# Patient Record
Sex: Male | Born: 1955 | Race: Black or African American | Hispanic: No | Marital: Married | Smoking: Current every day smoker
Health system: Southern US, Community
[De-identification: ages and names within clinical notes are randomized; demographics above are authoritative.]

## PROBLEM LIST (undated history)

## (undated) DIAGNOSIS — C801 Malignant (primary) neoplasm, unspecified: Secondary | ICD-10-CM

## (undated) DIAGNOSIS — F319 Bipolar disorder, unspecified: Secondary | ICD-10-CM

## (undated) DIAGNOSIS — J449 Chronic obstructive pulmonary disease, unspecified: Secondary | ICD-10-CM

---

## 2000-06-21 ENCOUNTER — Encounter: Payer: Self-pay | Admitting: Emergency Medicine

## 2000-06-21 ENCOUNTER — Emergency Department (HOSPITAL_COMMUNITY): Admission: EM | Admit: 2000-06-21 | Discharge: 2000-06-21 | Payer: Self-pay | Admitting: Emergency Medicine

## 2016-02-07 ENCOUNTER — Emergency Department (HOSPITAL_COMMUNITY): Payer: Medicare Other | Admitting: Certified Registered"

## 2016-02-07 ENCOUNTER — Inpatient Hospital Stay (HOSPITAL_COMMUNITY)
Admission: EM | Admit: 2016-02-07 | Discharge: 2016-02-15 | DRG: 459 | Disposition: A | Discharge status: Skilled Nursing Facility | Payer: Medicare Other | Attending: Neurological Surgery | Admitting: Neurological Surgery

## 2016-02-07 ENCOUNTER — Emergency Department (HOSPITAL_COMMUNITY): Payer: Medicare Other

## 2016-02-07 ENCOUNTER — Encounter (HOSPITAL_COMMUNITY)
Admission: EM | Disposition: A | Discharge status: Skilled Nursing Facility | Payer: Self-pay | Source: Home / Self Care | Attending: Neurological Surgery

## 2016-02-07 ENCOUNTER — Encounter (HOSPITAL_COMMUNITY): Payer: Self-pay | Admitting: Emergency Medicine

## 2016-02-07 DIAGNOSIS — S50311A Abrasion of right elbow, initial encounter: Secondary | ICD-10-CM | POA: Diagnosis present

## 2016-02-07 DIAGNOSIS — S32011A Stable burst fracture of first lumbar vertebra, initial encounter for closed fracture: Secondary | ICD-10-CM | POA: Diagnosis present

## 2016-02-07 DIAGNOSIS — S32000A Wedge compression fracture of unspecified lumbar vertebra, initial encounter for closed fracture: Secondary | ICD-10-CM

## 2016-02-07 DIAGNOSIS — Z452 Encounter for adjustment and management of vascular access device: Secondary | ICD-10-CM

## 2016-02-07 DIAGNOSIS — M532X6 Spinal instabilities, lumbar region: Secondary | ICD-10-CM | POA: Diagnosis present

## 2016-02-07 DIAGNOSIS — S32001A Stable burst fracture of unspecified lumbar vertebra, initial encounter for closed fracture: Secondary | ICD-10-CM

## 2016-02-07 DIAGNOSIS — J449 Chronic obstructive pulmonary disease, unspecified: Secondary | ICD-10-CM | POA: Diagnosis present

## 2016-02-07 DIAGNOSIS — N320 Bladder-neck obstruction: Secondary | ICD-10-CM | POA: Diagnosis not present

## 2016-02-07 DIAGNOSIS — S34139A Unspecified injury to sacral spinal cord, initial encounter: Secondary | ICD-10-CM | POA: Diagnosis present

## 2016-02-07 DIAGNOSIS — M549 Dorsalgia, unspecified: Secondary | ICD-10-CM | POA: Diagnosis present

## 2016-02-07 DIAGNOSIS — Z886 Allergy status to analgesic agent status: Secondary | ICD-10-CM

## 2016-02-07 DIAGNOSIS — F17210 Nicotine dependence, cigarettes, uncomplicated: Secondary | ICD-10-CM | POA: Diagnosis present

## 2016-02-07 DIAGNOSIS — G839 Paralytic syndrome, unspecified: Secondary | ICD-10-CM | POA: Diagnosis not present

## 2016-02-07 DIAGNOSIS — R339 Retention of urine, unspecified: Secondary | ICD-10-CM | POA: Diagnosis not present

## 2016-02-07 DIAGNOSIS — Z419 Encounter for procedure for purposes other than remedying health state, unspecified: Secondary | ICD-10-CM

## 2016-02-07 HISTORY — DX: Chronic obstructive pulmonary disease, unspecified: J44.9

## 2016-02-07 HISTORY — PX: POSTERIOR LUMBAR FUSION 4 LEVEL: SHX6037

## 2016-02-07 HISTORY — DX: Bipolar disorder, unspecified: F31.9

## 2016-02-07 HISTORY — DX: Malignant (primary) neoplasm, unspecified: C80.1

## 2016-02-07 SURGERY — POSTERIOR LUMBAR FUSION 4 LEVEL
Anesthesia: General

## 2016-02-07 MED ORDER — METHOCARBAMOL 1000 MG/10ML IJ SOLN
1000.0000 mg | Freq: Once | INTRAMUSCULAR | Status: AC
  Administered 2016-02-07: 1 g via INTRAVENOUS
  Filled 2016-02-07: qty 10

## 2016-02-07 MED ORDER — HEPARIN SODIUM (PORCINE) 1000 UNIT/ML IJ SOLN
INTRAMUSCULAR | Status: AC
  Filled 2016-02-07: qty 1

## 2016-02-07 MED ORDER — LACTATED RINGERS IV SOLN
INTRAVENOUS | Status: DC | PRN
  Administered 2016-02-07 (×3): via INTRAVENOUS

## 2016-02-07 MED ORDER — THROMBIN 20000 UNITS EX SOLR
CUTANEOUS | Status: DC | PRN
  Administered 2016-02-07: 23:00:00 via TOPICAL

## 2016-02-07 MED ORDER — HYDROMORPHONE HCL 1 MG/ML IJ SOLN
1.0000 mg | INTRAMUSCULAR | Status: DC | PRN
  Administered 2016-02-07: 1 mg via INTRAVENOUS
  Filled 2016-02-07: qty 1

## 2016-02-07 MED ORDER — SUCCINYLCHOLINE 20MG/ML (10ML) SYRINGE FOR MEDFUSION PUMP - OPTIME
INTRAMUSCULAR | Status: DC | PRN
  Administered 2016-02-07: 120 mg via INTRAVENOUS

## 2016-02-07 MED ORDER — 0.9 % SODIUM CHLORIDE (POUR BTL) OPTIME
TOPICAL | Status: DC | PRN
  Administered 2016-02-07: 1000 mL

## 2016-02-07 MED ORDER — FENTANYL CITRATE (PF) 100 MCG/2ML IJ SOLN
INTRAMUSCULAR | Status: DC | PRN
  Administered 2016-02-07: 150 ug via INTRAVENOUS
  Administered 2016-02-07: 100 ug via INTRAVENOUS
  Administered 2016-02-08 (×2): 50 ug via INTRAVENOUS

## 2016-02-07 MED ORDER — BUPIVACAINE LIPOSOME 1.3 % IJ SUSP
20.0000 mL | Freq: Once | INTRAMUSCULAR | Status: DC
  Filled 2016-02-07: qty 20

## 2016-02-07 MED ORDER — MIDAZOLAM HCL 5 MG/5ML IJ SOLN
INTRAMUSCULAR | Status: DC | PRN
  Administered 2016-02-07: 2 mg via INTRAVENOUS

## 2016-02-07 MED ORDER — SODIUM CHLORIDE 0.9 % IR SOLN
Status: DC | PRN
  Administered 2016-02-07 – 2016-02-08 (×2)

## 2016-02-07 MED ORDER — DIAZEPAM 5 MG/ML IJ SOLN
2.5000 mg | Freq: Once | INTRAMUSCULAR | Status: AC
  Administered 2016-02-07: 2.5 mg via INTRAVENOUS
  Filled 2016-02-07: qty 2

## 2016-02-07 MED ORDER — ROCURONIUM BROMIDE 100 MG/10ML IV SOLN
INTRAVENOUS | Status: DC | PRN
  Administered 2016-02-07: 10 mg via INTRAVENOUS
  Administered 2016-02-07: 20 mg via INTRAVENOUS
  Administered 2016-02-07: 50 mg via INTRAVENOUS
  Administered 2016-02-08: 10 mg via INTRAVENOUS

## 2016-02-07 MED ORDER — LIDOCAINE HCL (CARDIAC) 20 MG/ML IV SOLN
INTRAVENOUS | Status: DC | PRN
  Administered 2016-02-07: 60 mg via INTRAVENOUS

## 2016-02-07 MED ORDER — VANCOMYCIN HCL 1000 MG IV SOLR
INTRAVENOUS | Status: DC | PRN
  Administered 2016-02-08 (×2): 1000 mg via TOPICAL

## 2016-02-07 MED ORDER — PROPOFOL 10 MG/ML IV BOLUS
INTRAVENOUS | Status: DC | PRN
  Administered 2016-02-07: 140 mg via INTRAVENOUS

## 2016-02-07 MED ORDER — BUPIVACAINE LIPOSOME 1.3 % IJ SUSP
INTRAMUSCULAR | Status: DC | PRN
  Administered 2016-02-07: 20 mL

## 2016-02-07 MED ORDER — HYDROMORPHONE HCL 1 MG/ML IJ SOLN
1.0000 mg | Freq: Once | INTRAMUSCULAR | Status: AC
  Administered 2016-02-07: 1 mg via INTRAMUSCULAR
  Filled 2016-02-07: qty 1

## 2016-02-07 MED ORDER — PHENYLEPHRINE HCL 10 MG/ML IJ SOLN
10.0000 mg | INTRAVENOUS | Status: DC | PRN
  Administered 2016-02-07: 30 ug/min via INTRAVENOUS

## 2016-02-07 MED ORDER — OXYCODONE-ACETAMINOPHEN 5-325 MG PO TABS
2.0000 | ORAL_TABLET | Freq: Once | ORAL | Status: AC
  Administered 2016-02-07: 2 via ORAL
  Filled 2016-02-07: qty 2

## 2016-02-07 MED ORDER — OXYCODONE-ACETAMINOPHEN 5-325 MG PO TABS: 1.0000 | ORAL_TABLET | ORAL | Status: AC | PRN

## 2016-02-07 MED ORDER — KETAMINE HCL-SODIUM CHLORIDE 100-0.9 MG/10ML-% IV SOSY
0.3000 mg/kg | PREFILLED_SYRINGE | Freq: Once | INTRAVENOUS | Status: AC
  Administered 2016-02-07: 20 mg via INTRAVENOUS
  Filled 2016-02-07: qty 10

## 2016-02-07 MED ORDER — CEFAZOLIN SODIUM 1 G IJ SOLR
INTRAMUSCULAR | Status: DC | PRN
  Administered 2016-02-07: 2 g via INTRAMUSCULAR

## 2016-02-07 MED ORDER — PHENYLEPHRINE HCL 10 MG/ML IJ SOLN
INTRAMUSCULAR | Status: DC | PRN
  Administered 2016-02-07: 120 ug via INTRAVENOUS
  Administered 2016-02-07: 240 ug via INTRAVENOUS
  Administered 2016-02-07 (×2): 120 ug via INTRAVENOUS

## 2016-02-07 MED ORDER — ACETAMINOPHEN 10 MG/ML IV SOLN
INTRAVENOUS | Status: DC | PRN
  Administered 2016-02-07: 1000 mg via INTRAVENOUS

## 2016-02-07 MED ORDER — FENTANYL CITRATE (PF) 100 MCG/2ML IJ SOLN
50.0000 ug | Freq: Once | INTRAMUSCULAR | Status: AC
  Administered 2016-02-07: 50 ug via INTRAVENOUS
  Filled 2016-02-07: qty 2

## 2016-02-07 MED ORDER — KETAMINE HCL 100 MG/ML IJ SOLN
INTRAMUSCULAR | Status: AC
  Filled 2016-02-07: qty 1

## 2016-02-07 MED ORDER — ALBUMIN HUMAN 5 % IV SOLN
INTRAVENOUS | Status: DC | PRN
  Administered 2016-02-07: via INTRAVENOUS

## 2016-02-07 MED ORDER — LIDOCAINE-EPINEPHRINE 2 %-1:100000 IJ SOLN
INTRAMUSCULAR | Status: DC | PRN
  Administered 2016-02-07: 20 mL via INTRADERMAL

## 2016-02-07 MED ORDER — THROMBIN 5000 UNITS EX SOLR
OROMUCOSAL | Status: DC | PRN
  Administered 2016-02-07: 23:00:00 via TOPICAL

## 2016-02-07 SURGICAL SUPPLY — 90 items
ADH SKN CLS APL DERMABOND .7 (GAUZE/BANDAGES/DRESSINGS) ×2
APL SKNCLS STERI-STRIP NONHPOA (GAUZE/BANDAGES/DRESSINGS)
BAG DECANTER FOR FLEXI CONT (MISCELLANEOUS) ×3 IMPLANT
BENZOIN TINCTURE PRP APPL 2/3 (GAUZE/BANDAGES/DRESSINGS) ×1 IMPLANT
BLADE CLIPPER SURG (BLADE) ×2 IMPLANT
BLADE SURG 11 STRL SS (BLADE) ×1 IMPLANT
BONE ALLOSTEM MORSELIZED 5CC (Bone Implant) ×2 IMPLANT
BONE MATRIX OSTEOCEL PRO MED (Bone Implant) ×4 IMPLANT
BONE MATRIX OSTEOCEL PRO SM (Bone Implant) ×6 IMPLANT
BUR MATCHSTICK NEURO 3.0 LAGG (BURR) ×3 IMPLANT
BUR ROUND FLUTED 5 RND (BURR) ×2 IMPLANT
BUR ROUND FLUTED 5MM RND (BURR) ×1
CANISTER SUCT 3000ML PPV (MISCELLANEOUS) ×2 IMPLANT
CATH COUDE FOLEY 2W 5CC 16FR (CATHETERS) ×4 IMPLANT
CHLORAPREP W/TINT 26ML (MISCELLANEOUS) ×3 IMPLANT
CONNECTOR RELINE 40-50MM 5.5 (Connector) ×2 IMPLANT
DECANTER SPIKE VIAL GLASS SM (MISCELLANEOUS) ×3 IMPLANT
DERMABOND ADVANCED (GAUZE/BANDAGES/DRESSINGS) ×4
DERMABOND ADVANCED .7 DNX12 (GAUZE/BANDAGES/DRESSINGS) ×1 IMPLANT
DRAPE C-ARM 42X72 X-RAY (DRAPES) ×6 IMPLANT
DRAPE C-ARMOR (DRAPES) ×2 IMPLANT
DRAPE MICROSCOPE LEICA (MISCELLANEOUS) IMPLANT
DRAPE POUCH INSTRU U-SHP 10X18 (DRAPES) ×3 IMPLANT
DRAPE SURG 17X23 STRL (DRAPES) ×3 IMPLANT
DRSG OPSITE POSTOP 4X10 (GAUZE/BANDAGES/DRESSINGS) ×2 IMPLANT
ELECT REM PT RETURN 9FT ADLT (ELECTROSURGICAL) ×6
ELECTRODE REM PT RTRN 9FT ADLT (ELECTROSURGICAL) ×1 IMPLANT
EVACUATOR 1/8 PVC DRAIN (DRAIN) ×2 IMPLANT
GAUZE SPONGE 4X4 12PLY STRL (GAUZE/BANDAGES/DRESSINGS) IMPLANT
GAUZE SPONGE 4X4 16PLY XRAY LF (GAUZE/BANDAGES/DRESSINGS) IMPLANT
GLOVE BIOGEL PI IND STRL 7.5 (GLOVE) ×2 IMPLANT
GLOVE BIOGEL PI IND STRL 8.5 (GLOVE) IMPLANT
GLOVE BIOGEL PI INDICATOR 7.5 (GLOVE) ×4
GLOVE BIOGEL PI INDICATOR 8.5 (GLOVE) ×2
GLOVE ECLIPSE 7.5 STRL STRAW (GLOVE) ×4 IMPLANT
GLOVE ECLIPSE 8.5 STRL (GLOVE) ×2 IMPLANT
GLOVE SS BIOGEL STRL SZ 7 (GLOVE) ×3 IMPLANT
GLOVE SUPERSENSE BIOGEL SZ 7 (GLOVE) ×6
GOWN STRL REUS W/ TWL LRG LVL3 (GOWN DISPOSABLE) ×2 IMPLANT
GOWN STRL REUS W/ TWL XL LVL3 (GOWN DISPOSABLE) IMPLANT
GOWN STRL REUS W/TWL 2XL LVL3 (GOWN DISPOSABLE) ×2 IMPLANT
GOWN STRL REUS W/TWL LRG LVL3 (GOWN DISPOSABLE) ×6
GOWN STRL REUS W/TWL XL LVL3 (GOWN DISPOSABLE)
HEMOSTAT POWDER KIT SURGIFOAM (HEMOSTASIS) ×1 IMPLANT
KIT BASIN OR (CUSTOM PROCEDURE TRAY) ×3 IMPLANT
KIT ROOM TURNOVER OR (KITS) ×3 IMPLANT
NDL HYPO 21X1.5 SAFETY (NEEDLE) ×2 IMPLANT
NDL HYPO 25X1 1.5 SAFETY (NEEDLE) ×1 IMPLANT
NEEDLE HYPO 21X1.5 SAFETY (NEEDLE) ×6 IMPLANT
NEEDLE HYPO 25X1 1.5 SAFETY (NEEDLE) ×3 IMPLANT
NS IRRIG 1000ML POUR BTL (IV SOLUTION) ×3 IMPLANT
PACK LAMINECTOMY NEURO (CUSTOM PROCEDURE TRAY) ×3 IMPLANT
PACK UNIVERSAL I (CUSTOM PROCEDURE TRAY) ×3 IMPLANT
PAD ARMBOARD 7.5X6 YLW CONV (MISCELLANEOUS) ×9 IMPLANT
PATTIES SURGICAL .5X1.5 (GAUZE/BANDAGES/DRESSINGS) ×3 IMPLANT
PEDIGUARD CURV (INSTRUMENTS) ×2 IMPLANT
PUTTY BONE ATTRAX 10CC STRIP (Putty) ×4 IMPLANT
ROD RELINE-O 5.5X300 STRT NS (Rod) IMPLANT
ROD RELINE-O 5.5X300MM STRT (Rod) ×6 IMPLANT
RUBBERBAND STERILE (MISCELLANEOUS) IMPLANT
SCREW LOCK RELINE 5.5 TULIP (Screw) ×20 IMPLANT
SCREW SHANK RELINE 6.5X50MM 2S (Screw) ×4 IMPLANT
SCREW SHANK RELINE 6.5X55MM 2S (Screw) ×8 IMPLANT
SCREW SHANK RELINE-O 6.5X45MM (Screw) ×8 IMPLANT
SEALER BIPOLAR AQUA 2.3 (INSTRUMENTS) ×2 IMPLANT
SPINE TULIP RELINE MOD (Neuro Prosthesis/Implant) ×30 IMPLANT
SPONGE NEURO XRAY DETECT 1X3 (DISPOSABLE) ×1 IMPLANT
SPONGE SURGIFOAM ABS GEL 100 (HEMOSTASIS) ×3 IMPLANT
STRIP SURGICAL 1 X 6 IN (GAUZE/BANDAGES/DRESSINGS) IMPLANT
STRIP SURGICAL 1/2 X 6 IN (GAUZE/BANDAGES/DRESSINGS) IMPLANT
STRIP SURGICAL 1/4 X 6 IN (GAUZE/BANDAGES/DRESSINGS) IMPLANT
STRIP SURGICAL 3/4 X 6 IN (GAUZE/BANDAGES/DRESSINGS) IMPLANT
SUT STRATAFIX 1PDS 45CM VIOLET (SUTURE) ×2 IMPLANT
SUT STRATAFIX MNCRL+ 3-0 PS-2 (SUTURE) ×1
SUT STRATAFIX MONOCRYL 3-0 (SUTURE) ×2
SUT STRATAFIX SPIRAL + 2-0 (SUTURE) ×2 IMPLANT
SUT VIC AB 0 CT1 18XCR BRD8 (SUTURE) ×2 IMPLANT
SUT VIC AB 0 CT1 8-18 (SUTURE) ×3
SUT VIC AB 2-0 CT1 18 (SUTURE) ×2 IMPLANT
SUT VIC AB 3-0 SH 8-18 (SUTURE) ×2 IMPLANT
SUT VIC AB 4-0 PS2 27 (SUTURE) ×1 IMPLANT
SUTURE STRATFX MNCRL+ 3-0 PS-2 (SUTURE) IMPLANT
SYR 20CC LL (SYRINGE) ×3 IMPLANT
SYR 30ML LL (SYRINGE) ×5 IMPLANT
TOWEL OR 17X24 6PK STRL BLUE (TOWEL DISPOSABLE) ×3 IMPLANT
TOWEL OR 17X26 10 PK STRL BLUE (TOWEL DISPOSABLE) ×3 IMPLANT
TUBE CONNECTING 12'X1/4 (SUCTIONS)
TUBE CONNECTING 12X1/4 (SUCTIONS) ×1 IMPLANT
TULIP SPINE RELINE MOD (Neuro Prosthesis/Implant) IMPLANT
WATER STERILE IRR 1000ML POUR (IV SOLUTION) ×3 IMPLANT
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
## 2016-02-07 NOTE — ED Notes (Signed)
Charge RN at [facility] ED called, aware pt coming.
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
## 2016-02-07 NOTE — Progress Notes (Signed)
I assumed care of this patient at shift change. The plan is to follow up on the CT scan and if there is no retropulsion then we will have the pt follow up with neurosurgery.   1700: Informed the patient that his L1 compression fracture has retropulsion and we will consult neurosurgery. Patient states his pain was not well controlled so I ordered Dilaudid 1 mg q2 hours PRN. I discussed the case with Dr. Maryan Rued and she will place the order for the consult and speak with neurosurgery regarding this patient's care. Patient will be transferred to [facility] ER.  1930: Transported here for patient, patient in severe pain even after Ketamine, will give 50 g fentanyl before he leaves.
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
## 2016-02-07 NOTE — Discharge Instructions (Signed)
Take the prescribed medication as directed.  Use caution when taking this, it can make you sleepy/drowsy. Follow-up with [place] neurosurgery-- call to make appt. Return to the ED for new or worsening symptoms.

## 2016-02-07 NOTE — Anesthesia Procedure Notes (Signed)
Procedure Name: Intubation Date/Time: 02/07/2016 9:51 PM Performed by: Lance Coon Pre-anesthesia Checklist: Patient identified, Emergency Drugs available, Suction available, Patient being monitored and Timeout performed Patient Re-evaluated:Patient Re-evaluated prior to inductionOxygen Delivery Method: Circle system utilized Preoxygenation: Pre-oxygenation with 100% oxygen Intubation Type: IV induction, Cricoid Pressure applied and Rapid sequence Laryngoscope Size: Miller and 3 Grade View: Grade I Tube type: Oral Number of attempts: 1 Airway Equipment and Method: Stylet Placement Confirmation: ETT inserted through vocal cords under direct vision,  positive ETCO2 and breath sounds checked- equal and bilateral Secured at: 22 cm Tube secured with: Tape Dental Injury: Teeth and Oropharynx as per pre-operative assessment

## 2016-02-07 NOTE — ED Notes (Signed)
Off floor for testing 

## 2016-02-07 NOTE — ED Notes (Signed)
Patient transported to CT 
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
## 2016-02-07 NOTE — ED Provider Notes (Signed)
Patient with burst fracture retropulsion. Spoke with Dr. daily who wants patient transferred to the [facility] emergency room. Patient is to remain flat. Due to ongoing pain will try analgesic ketamine.  Blanchie Dessert, MD 02/07/16 [phone]

## 2016-02-07 NOTE — ED Notes (Signed)
Pt desated to 80%RA after giving Dilaudid and Valium. Started O2 at 3lpm via [place].
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
## 2016-02-08 ENCOUNTER — Inpatient Hospital Stay (HOSPITAL_COMMUNITY): Payer: Medicare Other

## 2016-02-08 LAB — COMPREHENSIVE METABOLIC PANEL
ALBUMIN: 3.1 g/dL — AB (ref 3.5–5.0)
ALK PHOS: 45 U/L (ref 38–126)
ALK PHOS: 50 U/L (ref 38–126)
ALT: 11 U/L — ABNORMAL LOW (ref 17–63)
ALT: 14 U/L — ABNORMAL LOW (ref 17–63)
ANION GAP: 12 (ref 5–15)
AST: 18 U/L (ref 15–41)
AST: 27 U/L (ref 15–41)
Albumin: 3.2 g/dL — ABNORMAL LOW (ref 3.5–5.0)
Anion gap: 11 (ref 5–15)
BILIRUBIN TOTAL: 0.4 mg/dL (ref 0.3–1.2)
BUN: 9 mg/dL (ref 6–20)
BUN: 7 mg/dL (ref 6–20)
CALCIUM: 9 mg/dL (ref 8.9–10.3)
CO2: 21 mmol/L — ABNORMAL LOW (ref 22–32)
CO2: 20 mmol/L — AB (ref 22–32)
Calcium: 8.8 mg/dL — ABNORMAL LOW (ref 8.9–10.3)
Chloride: 107 mmol/L (ref 101–111)
Chloride: 107 mmol/L (ref 101–111)
Creatinine, Ser: 1.02 mg/dL (ref 0.61–1.24)
Creatinine, Ser: 1 mg/dL (ref 0.61–1.24)
GFR calc Af Amer: >60 mL/min (ref >60)
GFR calc non Af Amer: >60 mL/min (ref >60)
GLUCOSE: 144 mg/dL — AB (ref 65–99)
Glucose, Bld: 132 mg/dL — ABNORMAL HIGH (ref 65–99)
POTASSIUM: 3.6 mmol/L (ref 3.5–5.1)
Potassium: 3 mmol/L — ABNORMAL LOW (ref 3.5–5.1)
SODIUM: 139 mmol/L (ref 135–145)
Sodium: 139 mmol/L (ref 135–145)
TOTAL PROTEIN: 5.7 g/dL — AB (ref 6.5–8.1)
Total Bilirubin: 0.6 mg/dL (ref 0.3–1.2)
Total Protein: 5.4 g/dL — ABNORMAL LOW (ref 6.5–8.1)

## 2016-02-08 LAB — TYPE AND SCREEN
ABO/RH(D): O POS
ANTIBODY SCREEN: NEGATIVE

## 2016-02-08 LAB — CBC WITH DIFFERENTIAL/PLATELET
BASOS ABS: 0 10*3/uL (ref 0.0–0.1)
BASOS PCT: 0 %
EOS ABS: 0 10*3/uL (ref 0.0–0.7)
Eosinophils Relative: 0 %
HCT: 31.1 % — ABNORMAL LOW (ref 39.0–52.0)
HEMOGLOBIN: 10.2 g/dL — AB (ref 13.0–17.0)
Lymphocytes Relative: 3 %
Lymphs Abs: 0.3 10*3/uL — ABNORMAL LOW (ref 0.7–4.0)
MCH: 31.2 pg (ref 26.0–34.0)
MCHC: 32.8 g/dL (ref 30.0–36.0)
MCV: 95.1 fL (ref 78.0–100.0)
MONOS PCT: 3 %
Monocytes Absolute: 0.3 10*3/uL (ref 0.1–1.0)
NEUTROS PCT: 95 %
Neutro Abs: 11.8 10*3/uL — ABNORMAL HIGH (ref 1.7–7.7)
Platelets: 104 10*3/uL — ABNORMAL LOW (ref 150–400)
RBC: 3.27 MIL/uL — ABNORMAL LOW (ref 4.22–5.81)
RDW: 13.2 % (ref 11.5–15.5)
WBC: 12.5 10*3/uL — ABNORMAL HIGH (ref 4.0–10.5)

## 2016-02-08 LAB — CBC
HCT: 29.3 % — ABNORMAL LOW (ref 39.0–52.0)
Hemoglobin: 9.6 g/dL — ABNORMAL LOW (ref 13.0–17.0)
MCH: 30.7 pg (ref 26.0–34.0)
MCHC: 32.8 g/dL (ref 30.0–36.0)
MCV: 93.6 fL (ref 78.0–100.0)
PLATELETS: 114 10*3/uL — AB (ref 150–400)
RBC: 3.13 MIL/uL — ABNORMAL LOW (ref 4.22–5.81)
RDW: 13 % (ref 11.5–15.5)
WBC: 7 10*3/uL (ref 4.0–10.5)

## 2016-02-08 LAB — APTT
APTT: 36 s (ref 24–37)
aPTT: 30 seconds (ref 24–37)

## 2016-02-08 LAB — POCT I-STAT 7, (LYTES, BLD GAS, ICA,H+H)
ACID-BASE DEFICIT: 2 mmol/L (ref 0.0–2.0)
Bicarbonate: 25.1 mEq/L — ABNORMAL HIGH (ref 20.0–24.0)
Calcium, Ion: 1.3 mmol/L (ref 1.13–1.30)
HEMATOCRIT: 38 % — AB (ref 39.0–52.0)
HEMOGLOBIN: 12.9 g/dL — AB (ref 13.0–17.0)
O2 SAT: 100 %
PH ART: 7.306 — AB (ref 7.350–7.450)
POTASSIUM: 3 mmol/L — AB (ref 3.5–5.1)
Sodium: 139 mmol/L (ref 135–145)
TCO2: 27 mmol/L (ref 0–100)
pCO2 arterial: 50.4 mmHg — ABNORMAL HIGH (ref 35.0–45.0)
pO2, Arterial: 413 mmHg — ABNORMAL HIGH (ref 80.0–100.0)

## 2016-02-08 LAB — PROTIME-INR
INR: 1.27 (ref 0.00–1.49)
INR: 1.22 (ref 0.00–1.49)
Prothrombin Time: 16 seconds — ABNORMAL HIGH (ref 11.6–15.2)
Prothrombin Time: 15.6 seconds — ABNORMAL HIGH (ref 11.6–15.2)

## 2016-02-08 LAB — ABO/RH: ABO/RH(D): O POS

## 2016-02-08 MED ORDER — ZOLPIDEM TARTRATE 5 MG PO TABS
5.0000 mg | ORAL_TABLET | Freq: Every evening | ORAL | Status: DC | PRN
  Administered 2016-02-13: 5 mg via ORAL
  Filled 2016-02-08: qty 1

## 2016-02-08 MED ORDER — ONDANSETRON HCL 4 MG/2ML IJ SOLN: 4.0000 mg | Freq: Once | INTRAMUSCULAR | Status: DC | PRN

## 2016-02-08 MED ORDER — DEXAMETHASONE SODIUM PHOSPHATE 10 MG/ML IJ SOLN
INTRAMUSCULAR | Status: DC | PRN
  Administered 2016-02-07: 5 mg via INTRAVENOUS

## 2016-02-08 MED ORDER — PHENOL 1.4 % MT LIQD: 1.0000 | OROMUCOSAL | Status: DC | PRN

## 2016-02-08 MED ORDER — CEFAZOLIN SODIUM-DEXTROSE 2-4 GM/100ML-% IV SOLN
2.0000 g | INTRAVENOUS | Status: DC
Start: 2016-02-08 — End: 2016-02-08

## 2016-02-08 MED ORDER — PANTOPRAZOLE SODIUM 40 MG PO TBEC
40.0000 mg | DELAYED_RELEASE_TABLET | Freq: Every day | ORAL | Status: DC
  Administered 2016-02-08 – 2016-02-14 (×7): 40 mg via ORAL
  Filled 2016-02-08 (×7): qty 1

## 2016-02-08 MED ORDER — PANTOPRAZOLE SODIUM 40 MG IV SOLR: 40.0000 mg | Freq: Every day | INTRAVENOUS | Status: DC

## 2016-02-08 MED ORDER — GABAPENTIN 100 MG PO CAPS
200.0000 mg | ORAL_CAPSULE | Freq: Once | ORAL | Status: AC
  Administered 2016-02-08: 200 mg via ORAL
  Filled 2016-02-08: qty 2

## 2016-02-08 MED ORDER — MENTHOL 3 MG MT LOZG: 1.0000 | LOZENGE | OROMUCOSAL | Status: DC | PRN

## 2016-02-08 MED ORDER — SODIUM CHLORIDE 0.9% FLUSH
3.0000 mL | Freq: Two times a day (BID) | INTRAVENOUS | Status: DC
  Administered 2016-02-10: 3 mL via INTRAVENOUS

## 2016-02-08 MED ORDER — OXYCODONE HCL 5 MG PO TABS: 5.0000 mg | ORAL_TABLET | Freq: Once | ORAL | Status: DC | PRN

## 2016-02-08 MED ORDER — GABAPENTIN 100 MG PO CAPS
200.0000 mg | ORAL_CAPSULE | Freq: Three times a day (TID) | ORAL | Status: DC
Start: 2016-02-08 — End: 2016-02-12
  Administered 2016-02-08 – 2016-02-11 (×12): 200 mg via ORAL
  Filled 2016-02-08 (×12): qty 2

## 2016-02-08 MED ORDER — DIAZEPAM 5 MG PO TABS
5.0000 mg | ORAL_TABLET | Freq: Four times a day (QID) | ORAL | Status: DC | PRN
  Administered 2016-02-11 – 2016-02-13 (×3): 5 mg via ORAL
  Filled 2016-02-08 (×3): qty 1

## 2016-02-08 MED ORDER — OXYCODONE HCL 5 MG/5ML PO SOLN: 5.0000 mg | Freq: Once | ORAL | Status: DC | PRN

## 2016-02-08 MED ORDER — FLEET ENEMA 7-19 GM/118ML RE ENEM: 1.0000 | ENEMA | Freq: Once | RECTAL | Status: DC | PRN

## 2016-02-08 MED ORDER — BISACODYL 5 MG PO TBEC: 5.0000 mg | DELAYED_RELEASE_TABLET | Freq: Every day | ORAL | Status: DC | PRN

## 2016-02-08 MED ORDER — SENNA 8.6 MG PO TABS
1.0000 | ORAL_TABLET | Freq: Two times a day (BID) | ORAL | Status: DC
  Administered 2016-02-08 – 2016-02-13 (×12): 8.6 mg via ORAL
  Filled 2016-02-08 (×14): qty 1

## 2016-02-08 MED ORDER — ONDANSETRON HCL 4 MG/2ML IJ SOLN: 4.0000 mg | INTRAMUSCULAR | Status: DC | PRN

## 2016-02-08 MED ORDER — SODIUM CHLORIDE 0.9 % IV SOLN: Freq: Once | INTRAVENOUS | Status: DC

## 2016-02-08 MED ORDER — SODIUM CHLORIDE 0.9% FLUSH: 3.0000 mL | INTRAVENOUS | Status: DC | PRN

## 2016-02-08 MED ORDER — OXYCODONE HCL ER 20 MG PO T12A
20.0000 mg | EXTENDED_RELEASE_TABLET | Freq: Two times a day (BID) | ORAL | Status: DC
  Administered 2016-02-08 – 2016-02-13 (×13): 20 mg via ORAL
  Filled 2016-02-08 (×13): qty 1

## 2016-02-08 MED ORDER — HYDROMORPHONE HCL 1 MG/ML IJ SOLN
1.0000 mg | INTRAMUSCULAR | Status: AC | PRN
  Administered 2016-02-08 (×3): 1 mg via INTRAVENOUS
  Filled 2016-02-08 (×3): qty 1

## 2016-02-08 MED ORDER — HYDROMORPHONE HCL 1 MG/ML IJ SOLN
0.2500 mg | INTRAMUSCULAR | Status: DC | PRN
  Administered 2016-02-08 (×4): 0.5 mg via INTRAVENOUS

## 2016-02-08 MED ORDER — OXYCODONE HCL 5 MG PO TABS
5.0000 mg | ORAL_TABLET | ORAL | Status: DC | PRN
  Administered 2016-02-08 – 2016-02-15 (×14): 5 mg via ORAL
  Filled 2016-02-08 (×14): qty 1

## 2016-02-08 MED ORDER — METHOCARBAMOL 1000 MG/10ML IJ SOLN
1000.0000 mg | Freq: Once | INTRAVENOUS | Status: AC
  Administered 2016-02-08: 1000 mg via INTRAVENOUS
  Filled 2016-02-08: qty 10

## 2016-02-08 MED ORDER — SODIUM CHLORIDE 0.9 % IV SOLN
INTRAVENOUS | Status: DC
  Administered 2016-02-08: 03:00:00 via INTRAVENOUS

## 2016-02-08 MED ORDER — METHOCARBAMOL 750 MG PO TABS
750.0000 mg | ORAL_TABLET | Freq: Three times a day (TID) | ORAL | Status: DC
  Administered 2016-02-08 – 2016-02-15 (×29): 750 mg via ORAL
  Filled 2016-02-08 (×29): qty 1

## 2016-02-08 MED ORDER — DOCUSATE SODIUM 100 MG PO CAPS
100.0000 mg | ORAL_CAPSULE | Freq: Two times a day (BID) | ORAL | Status: DC
  Administered 2016-02-08 – 2016-02-13 (×12): 100 mg via ORAL
  Filled 2016-02-08 (×14): qty 1

## 2016-02-08 MED ORDER — CEFAZOLIN SODIUM 1-5 GM-% IV SOLN
1.0000 g | Freq: Three times a day (TID) | INTRAVENOUS | Status: DC
  Administered 2016-02-08 – 2016-02-10 (×7): 1 g via INTRAVENOUS
  Filled 2016-02-08 (×9): qty 50

## 2016-02-08 MED ORDER — ACETAMINOPHEN 500 MG PO TABS
1000.0000 mg | ORAL_TABLET | Freq: Four times a day (QID) | ORAL | Status: DC
  Administered 2016-02-08 – 2016-02-14 (×21): 1000 mg via ORAL
  Filled 2016-02-08 (×22): qty 2

## 2016-02-08 MED ORDER — ACETAMINOPHEN 10 MG/ML IV SOLN
1000.0000 mg | Freq: Once | INTRAVENOUS | Status: AC
  Administered 2016-02-08: 1000 mg via INTRAVENOUS
  Filled 2016-02-08: qty 100

## 2016-02-08 MED ORDER — SODIUM CHLORIDE 0.9 % IV SOLN: 250.0000 mL | INTRAVENOUS | Status: DC

## 2016-02-08 MED ORDER — SUGAMMADEX SODIUM 200 MG/2ML IV SOLN
INTRAVENOUS | Status: DC | PRN
  Administered 2016-02-08: 150 mg via INTRAVENOUS

## 2016-02-08 MED FILL — Sodium Chloride IV Soln 0.9%: INTRAVENOUS | Qty: 1000 | Status: AC

## 2016-02-08 MED FILL — Heparin Sodium (Porcine) Inj 1000 Unit/ML: INTRAMUSCULAR | Qty: 30 | Status: AC

## 2016-02-08 NOTE — Care Management Note (Signed)
Case Management Note  Patient Details  Name: Jared Solomon MRN: [id number] Date of Birth: 01/25/1956  Subjective/Objective:                    Action/Plan: Patient was admitted for a Thoracic ten-Lumbar three dorsal internal Fixation and Fusion, decompression and open reduction of lumbar one fracture.  Lives at home with family. Will follow for discharge needs pending PT/OT evals and physician orders.  Expected Discharge Date:                  Expected Discharge Plan:     In-House Referral:     Discharge planning Services     Post Acute Care Choice:    Choice offered to:     DME Arranged:    DME Agency:     HH Arranged:    HH Agency:     Status of Service:  In process, will continue to follow  Medicare Important Message Given:    Date Medicare IM Given:    Medicare IM give by:    Date Additional Medicare IM Given:    Additional Medicare Important Message give by:     If discussed at [place] of Stay Meetings, dates discussed:    Additional Comments:  Rolm Baptise, RN 02/08/2016, 10:35 AM [phone]

## 2016-02-08 NOTE — Progress Notes (Signed)
PT Cancellation Note  Patient Details Name: Jared Solomon MRN: [id number] DOB: Nov 01, 1955   Cancelled Treatment:    Reason Eval/Treat Not Completed: Patient not medically ready Awaiting TLSO prior to mobility. Will follow up.   [name] 02/08/2016, 10:06 AM Wray Kearns, PT, DPT [phone]

## 2016-02-08 NOTE — Progress Notes (Signed)
Orthopedic Tech Progress Note Patient Details:  Jared Solomon Sep 29, 1956 [id number]  Patient ID: Jared Solomon, male   DOB: 11-03-1955, 60 y.o.   MRN: [id number]   Jared Solomon 02/08/2016, 11:31 AMCalled [employer] for TLSO.

## 2016-02-08 NOTE — Transfer of Care (Signed)
Immediate Anesthesia Transfer of Care Note  Patient: Jared Solomon  Procedure(s) Performed: Procedure(s): Thoracic ten-Lumbar three dorsal internal Fixation and Fusion, decompression and open reduction of lumbar one fracture (N/A)  Patient Location: PACU  Anesthesia Type:General  Level of Consciousness: awake and patient cooperative  Airway & Oxygen Therapy: Patient Spontanous Breathing and Patient connected to face mask oxygen  Post-op Assessment: Report given to RN and Post -op Vital signs reviewed and stable  Post vital signs: Reviewed and stable  Last Vitals:  Filed Vitals:   02/07/16 2022 02/07/16 2030  BP: 133/97 139/88  Pulse: 84 90  Temp: 36.5 C   Resp: 16 23    Last Pain:  Filed Vitals:   02/07/16 2035  PainSc: 10-Worst pain ever         Complications: No apparent anesthesia complications

## 2016-02-08 NOTE — Progress Notes (Signed)
No acute events Pain improved Awake and alert Moving legs better, 4/5 throughout bilaterally Incision c/d/i Stable Continue drains Therapy

## 2016-02-08 NOTE — Progress Notes (Signed)
Arrived from PACU. Alert and oriented. Surgical site pain 9/10. Dressing CDI. Oriented to room. Call light within reach.
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
## 2016-02-08 NOTE — Progress Notes (Signed)
Foley D/C. Unable to ambulate pt, awaiting Brace. Ortho notified to order Brace.

## 2016-02-08 NOTE — Evaluation (Signed)
Physical Therapy Evaluation Patient Details Name: Timari Suchanek MRN: [id number] DOB: 1956/08/29 Today's Date: 02/08/2016   History of Present Illness  Patient is a 60 y/o male with hx of COPD, bipolar disorder and ca presents s/p T10-L3 dorsal internal fixation and fusion with pedicle screws bilaterally at T10, T11, T12, L2, and L3; T12-L1 laminectomy for decompression due to burst fx after accident operating a dump trunk.   Clinical Impression  Patient presents with pain, generalized weakness BLEs, balance deficits and impaired mobility s/p above surgery. Education re: log roll technique, back precautions and disposition. Tolerated taking a few steps forward/backward however pt with bil knee instability and weakness resulting in need to sit. Pt adamant about returning home with support from son. Would benefit from CIR to maximize independence and mobility prior to return home. If pt refuses, pt will need RW, BSC, and HHPT. Will follow acutely.     Follow Up Recommendations CIR    Equipment Recommendations  Rolling walker with 5" wheels;3in1 (PT)    Recommendations for Other Services OT consult;Rehab consult     Precautions / Restrictions Precautions Precautions: Back Precaution Booklet Issued: No Precaution Comments: Reviewed precautions Required Braces or Orthoses: Spinal Brace Spinal Brace: Thoracolumbosacral orthotic;Applied in sitting position Restrictions Weight Bearing Restrictions: No      Mobility  Bed Mobility Overal bed mobility: Needs Assistance Bed Mobility: Rolling;Sidelying to Sit;Sit to Sidelying Rolling: Min guard Sidelying to sit: Min assist     Sit to sidelying: Mod assist General bed mobility comments: Cues for log roll technique. Increased time. Very slow. Assist with trunk to get to EOB. Assist with LEs to return to supine. Rolling to left Min guard assist.  Transfers Overall transfer level: Needs assistance Equipment used: Rolling walker (2  wheeled) Transfers: Sit to/from Stand Sit to Stand: Min assist         General transfer comment: Min A to boost from EOB with cues for hand placement/technique. Cues to adhere to back precautions.  Ambulation/Gait Ambulation/Gait assistance: Min assist Ambulation Distance (Feet): 2 Feet Assistive device: Rolling walker (2 wheeled) Gait Pattern/deviations: Step-to pattern;Decreased stride length;Shuffle;Narrow base of support Gait velocity: decreased Gait velocity interpretation: Below normal speed for age/gender General Gait Details: Able to take 2 steps forward/backward. Increased knee flexion and knee instability noted. Cues to widen BoS.  Stairs            Wheelchair Mobility    Modified Rankin (Stroke Patients Only)       Balance Overall balance assessment: Needs assistance Sitting-balance support: Feet supported;No upper extremity supported Sitting balance-Leahy Scale: Fair     Standing balance support: During functional activity;Bilateral upper extremity supported Standing balance-Leahy Scale: Poor Standing balance comment: Reliant on BUEs for support.                              Pertinent Vitals/Pain Pain Assessment: Faces Faces Pain Scale: Hurts even more Pain Location: back with mobility Pain Descriptors / Indicators: Grimacing;Guarding;Sore;Aching Pain Intervention(s): Monitored during session;Repositioned;Premedicated before session;Limited activity within patient's tolerance    Home Living Family/patient expects to be discharged to:: Private residence Living Arrangements: Alone;Children (reports son can stay with him) Available Help at Discharge: Family;Available 24 hours/day Type of Home: House Home Access: Stairs to enter Entrance Stairs-Rails: Right (partial rail) Entrance Stairs-Number of Steps: 4 Home Layout: One level Home Equipment: None      Prior Function Level of Independence: Independent  Hand  Dominance        Extremity/Trunk Assessment   Upper Extremity Assessment: Defer to OT evaluation           Lower Extremity Assessment: Generalized weakness (Decreased functional strength in BLEs.)         Communication   Communication: No difficulties  Cognition Arousal/Alertness: Awake/alert Behavior During Therapy: WFL for tasks assessed/performed Overall Cognitive Status: Within Functional Limits for tasks assessed                      General Comments General comments (skin integrity, edema, etc.): DIscussed disposition - post acute rehab however pt adamant about returning home.    Exercises        Assessment/Plan    PT Assessment Patient needs continued PT services  PT Diagnosis Difficulty walking;Generalized weakness;Abnormality of gait;Acute pain   PT Problem List Decreased strength;Pain;Decreased activity tolerance;Decreased balance;Decreased mobility;Decreased safety awareness;Decreased knowledge of precautions;Decreased knowledge of use of DME  PT Treatment Interventions Balance training;Gait training;Functional mobility training;Therapeutic activities;Therapeutic exercise;Patient/family education;Stair training;DME instruction   PT Goals (Current goals can be found in the Care Plan section) Acute Rehab PT Goals Patient Stated Goal: to return home and to decrease pain PT Goal Formulation: With patient Time For Goal Achievement: 02/22/16 Potential to Achieve Goals: Fair    Frequency Min 4X/week   Barriers to discharge Inaccessible home environment stairs to enter home     Co-evaluation               End of Session Equipment Utilized During Treatment: Gait belt;Back brace Activity Tolerance: Patient limited by pain Patient left: in bed;with call bell/phone within reach;with bed alarm set;with SCD's reapplied Nurse Communication: Mobility status         Time: [id number] PT Time Calculation (min) (ACUTE ONLY): 28 min   Charges:    PT Evaluation $PT Eval Moderate Complexity: 1 Procedure PT Treatments $Gait Training: 8-22 mins   PT G Codes:        Rosha Cocker A Reece Mcbroom 02/08/2016, 3:59 PM Wray Kearns, [place], DPT [phone]

## 2016-02-08 NOTE — Progress Notes (Signed)
CSW received consult regarding possible snf placement at discharge. Patient is refusing SNF and would like to go home with his son. He stated he also has other family to help take care of him.  CSW signing off.  Percell Locus Retta Pitcher LCSWA [phone]

## 2016-02-08 NOTE — Progress Notes (Signed)
Rehab Admissions Coordinator Note:  Patient was screened by Cleatrice Burke for appropriateness for an Inpatient Acute Rehab Consult per PT recommendation.  At this time, we are recommending [facility]. Or HH. Doubt insurance would approve an inpt rehab admission for this diagnosis.  Cleatrice Burke 02/08/2016, 4:47 PM  I can be reached at [phone].

## 2016-02-08 NOTE — Anesthesia Postprocedure Evaluation (Signed)
Anesthesia Post Note  Patient: Jared Solomon  Procedure(s) Performed: Procedure(s) (LRB): Thoracic ten-Lumbar three dorsal internal Fixation and Fusion, decompression and open reduction of lumbar one fracture (N/A)  Patient location during evaluation: PACU Anesthesia Type: General Level of consciousness: awake, awake and alert and oriented Pain management: pain level controlled Vital Signs Assessment: post-procedure vital signs reviewed and stable Respiratory status: spontaneous breathing, nonlabored ventilation and respiratory function stable Cardiovascular status: blood pressure returned to baseline Anesthetic complications: no    Last Vitals:  Filed Vitals:   02/08/16 0230 02/08/16 0244  BP: 129/80 122/75  Pulse: 99 94  Temp: 36.4 C 36.6 C  Resp: 23 20    Last Pain:  Filed Vitals:   02/08/16 0316  PainSc: 8                  Mackinley Kiehn COKER

## 2016-02-09 MED ORDER — TAMSULOSIN HCL 0.4 MG PO CAPS
0.4000 mg | ORAL_CAPSULE | Freq: Every day | ORAL | Status: DC
  Administered 2016-02-09 – 2016-02-15 (×7): 0.4 mg via ORAL
  Filled 2016-02-09 (×7): qty 1

## 2016-02-09 NOTE — Progress Notes (Signed)
Received new orders to start the patient on flomax 0.4mg  daily

## 2016-02-09 NOTE — Progress Notes (Signed)
Patient has been unable to void since foley was removed. Bladder scanned patient and results showed greater than 500 cc of retained urine. Straight cath performed and 500 cc of urine drained from bladder. Pt due to void at 0600. Will continue to monitor.

## 2016-02-09 NOTE — Progress Notes (Signed)
Physical Therapy Treatment Patient Details Name: Jared Solomon MRN: [id number] DOB: 1955/12/23 Today's Date: 02/09/2016    History of Present Illness Patient is a 60 y/o male with hx of COPD, bipolar disorder and ca presents s/p T10-L3 dorsal internal fixation and fusion with pedicle screws bilaterally at T10, T11, T12, L2, and L3; T12-L1 laminectomy for decompression due to burst fx after accident operating a dump trunk.     PT Comments    Patient progressing slowly towards PT goals. Continues to require assist with standing due to weakness in BLEs. Close chair follow needed due to unpredictability of knees during gait training. Fatigues. Reviewed back precautions. Will need to perform family training on how to donn/doff brace and assist with mobility as pt refusing rehab. Will follow acutely.   Follow Up Recommendations  Home health PT;Supervision/Assistance - 24 hour (pt refusing rehab)     Equipment Recommendations  Rolling walker with 5" wheels;3in1 (PT)    Recommendations for Other Services       Precautions / Restrictions Precautions Precautions: Back Precaution Booklet Issued: No Precaution Comments: Reviewed precautions Required Braces or Orthoses: Spinal Brace Spinal Brace: Thoracolumbosacral orthotic;Applied in sitting position Restrictions Weight Bearing Restrictions: No    Mobility  Bed Mobility Overal bed mobility: Needs Assistance Bed Mobility: Rolling;Sidelying to Sit;Sit to Sidelying Rolling: Min guard Sidelying to sit: Min guard     Sit to sidelying: Min assist General bed mobility comments: VCs for log roll technique. HOB flat with use of bed rails. Increased time and cues required.  Transfers Overall transfer level: Needs assistance Equipment used: Rolling walker (2 wheeled) Transfers: Sit to/from Stand Sit to Stand: Min assist;Mod assist         General transfer comment: Min A-Mod A to boost up and for balance in standing. Cues for hand  placement. Stood From chair x 2, EOB x 1. SPT chair to bed with Min A.  Ambulation/Gait Ambulation/Gait assistance: Mod assist Ambulation Distance (Feet): 40 Feet (+50') Assistive device: Rolling walker (2 wheeled) Gait Pattern/deviations: Step-through pattern;Narrow base of support;Scissoring;Trunk flexed Gait velocity: decreased Gait velocity interpretation: Below normal speed for age/gender General Gait Details: Cues to widen BoS and to avoid scissoring. Pt needs verbal and visual cues for foot placement. Increased hip/knee flexion when fatigued requiring Mod-Min A for balance. 1 seated rest break.   Stairs            Wheelchair Mobility    Modified Rankin (Stroke Patients Only)       Balance Overall balance assessment: Needs assistance Sitting-balance support: Feet supported;Bilateral upper extremity supported Sitting balance-Leahy Scale: Poor Sitting balance - Comments: LOB without UE support sitting EOB.   Standing balance support: During functional activity Standing balance-Leahy Scale: Poor Standing balance comment: Reilant on RW for support in standing.Bil knee instability.                    Cognition Arousal/Alertness: Awake/alert Behavior During Therapy: WFL for tasks assessed/performed Overall Cognitive Status: Within Functional Limits for tasks assessed                      Exercises      General Comments        Pertinent Vitals/Pain Pain Assessment: Faces Faces Pain Scale: Hurts even more Pain Location: back, heels, feet Pain Descriptors / Indicators: Aching;Grimacing;Pins and needles Pain Intervention(s): Monitored during session;Repositioned;Premedicated before session    Home Living Family/patient expects to be discharged to:: Private residence Living Arrangements: Alone;Children  Available Help at Discharge: Family;Available 24 hours/day Type of Home: House Home Access: Stairs to enter Entrance Stairs-Rails: Right Home  Layout: One level Home Equipment: None      Prior Function Level of Independence: Independent          PT Goals (current goals can now be found in the care plan section) Acute Rehab PT Goals Patient Stated Goal: to return home and to decrease pain Progress towards PT goals: Progressing toward goals    Frequency  Min 4X/week    PT Plan Discharge plan needs to be updated    Co-evaluation PT/OT/SLP Co-Evaluation/Treatment: Yes Reason for Co-Treatment: For patient/therapist safety PT goals addressed during session: Mobility/safety with mobility;Strengthening/ROM;Balance OT goals addressed during session: ADL's and self-care;Other (comment) (mobility)     End of Session Equipment Utilized During Treatment: Gait belt;Back brace Activity Tolerance: Patient tolerated treatment well Patient left: in bed;with call bell/phone within reach;with bed alarm set;with SCD's reapplied     Time: 1315-1347 PT Time Calculation (min) (ACUTE ONLY): 32 min  Charges:  $Gait Training: 8-22 mins                    G Codes:      [place] 02/09/2016, 3:00 PM Wray Kearns, [place], DPT [phone]

## 2016-02-09 NOTE — Evaluation (Signed)
Occupational Therapy Evaluation Patient Details Name: Jared Solomon MRN: [id number] DOB: May 02, 1956 Today's Date: 02/09/2016    History of Present Illness Patient is a 60 y/o male with hx of COPD, bipolar disorder and ca presents s/p T10-L3 dorsal internal fixation and fusion with pedicle screws bilaterally at T10, T11, T12, L2, and L3; T12-L1 laminectomy for decompression due to burst fx after accident operating a dump trunk.    Clinical Impression   Pt reports he was independent with ADLs and mobility PTA. Currently pt is overall min assist +2 for functional mobility and min-max assist for ADLs. Began safety, ADL, and back education with pt. Pt planning to d/c home with 24/7 supervision. Recommending HHOT for follow up in order to maximize independence and safety with ADLs and functional mobility upon return home. Pt would benefit from continued skilled OT to address established goals.    Follow Up Recommendations  Home health OT;Supervision/Assistance - 24 hour    Equipment Recommendations  3 in 1 bedside comode;Tub/shower bench;Other (comment) (AE)    Recommendations for Other Services       Precautions / Restrictions Precautions Precautions: Back Precaution Booklet Issued: No Precaution Comments: Reviewed precautions with pt. Required Braces or Orthoses: Spinal Brace Spinal Brace: Thoracolumbosacral orthotic;Applied in sitting position Restrictions Weight Bearing Restrictions: No      Mobility Bed Mobility Overal bed mobility: Needs Assistance Bed Mobility: Rolling;Sidelying to Sit;Supine to Sit Rolling: Min guard Sidelying to sit: Min guard     Sit to sidelying: Min assist (for LEs back to bed) General bed mobility comments: VCs for log roll technique. HOB flat with use of bed rails. Increased time required.  Transfers Overall transfer level: Needs assistance Equipment used: Rolling walker (2 wheeled) Transfers: Sit to/from Stand Sit to Stand: Min assist         General transfer comment: Min assist to boost up and for balance in standing. From chair x 2, EOB x 1.    Balance Overall balance assessment: Needs assistance Sitting-balance support: Feet supported;No upper extremity supported Sitting balance-Leahy Scale: Poor     Standing balance support: Bilateral upper extremity supported Standing balance-Leahy Scale: Poor Standing balance comment: RW for support                            ADL Overall ADL's : Needs assistance/impaired Eating/Feeding: Set up;Sitting   Grooming: Minimal assistance;Sitting   Upper Body Bathing: Minimal assitance;Sitting   Lower Body Bathing: Maximal assistance;Sit to/from stand   Upper Body Dressing : Maximal assistance;Sitting Upper Body Dressing Details (indicate cue type and reason): to don TLSO brace Lower Body Dressing: Maximal assistance;Sit to/from stand   Toilet Transfer: +2 for safety/equipment;Ambulation;BSC;RW;Minimal assistance (BSC over toilet)   Toileting- Clothing Manipulation and Hygiene: Maximal assistance;Sit to/from stand       Functional mobility during ADLs: +2 for safety/equipment;Rolling walker;Minimal assistance General ADL Comments: Educated pt on log roll technique, maintaining back precautions during functional activities.     Vision Vision Assessment?: No apparent visual deficits   Perception     Praxis      Pertinent Vitals/Pain Pain Assessment: Faces Faces Pain Scale: Hurts even more Pain Location: back, legs, feet Pain Descriptors / Indicators: Aching;Pins and needles;Sore;Grimacing Pain Intervention(s): Monitored during session;Premedicated before session;Repositioned     Hand Dominance Right   Extremity/Trunk Assessment Upper Extremity Assessment Upper Extremity Assessment: Overall WFL for tasks assessed   Lower Extremity Assessment Lower Extremity Assessment: Defer to PT evaluation  Cervical / Trunk Assessment Cervical / Trunk  Assessment: Other exceptions Cervical / Trunk Exceptions: s/p lumbar sx   Communication Communication Communication: No difficulties   Cognition Arousal/Alertness: Awake/alert Behavior During Therapy: WFL for tasks assessed/performed Overall Cognitive Status: Within Functional Limits for tasks assessed                     General Comments       Exercises       Shoulder Instructions      Home Living Family/patient expects to be discharged to:: Private residence Living Arrangements: Alone;Children Available Help at Discharge: Family;Available 24 hours/day Type of Home: House Home Access: Stairs to enter [employer] of Steps: 4 Entrance Stairs-Rails: Right Home Layout: One level     Bathroom Shower/Tub: Tub/shower unit Shower/tub characteristics: Architectural technologist: Standard     Home Equipment: None          Prior Functioning/Environment Level of Independence: Independent             OT Diagnosis: Generalized weakness;Acute pain   OT Problem List: Decreased strength;Decreased activity tolerance;Impaired balance (sitting and/or standing)   OT Treatment/Interventions: Self-care/ADL training;Energy conservation;DME and/or AE instruction;Therapeutic activities;Patient/family education;Balance training    OT Goals(Current goals can be found in the care plan section) Acute Rehab OT Goals Patient Stated Goal: to return home and to decrease pain OT Goal Formulation: With patient Time For Goal Achievement: 02/23/16 Potential to Achieve Goals: Good ADL Goals Pt Will Perform Grooming: with supervision;standing Pt Will Perform Lower Body Bathing: with supervision;sit to/from stand;with adaptive equipment Pt Will Perform Lower Body Dressing: with supervision;with adaptive equipment;sit to/from stand Pt Will Transfer to Toilet: with supervision;ambulating;bedside commode (BSC over toilet) Pt Will Perform Toileting - Clothing Manipulation and  hygiene: with supervision;sit to/from stand;with adaptive equipment Additional ADL Goal #1: Pt will independently verbally recall 3/3 back precautions and maintain throughout ADL. Additional ADL Goal #2: Pt/caregiver will independently don/doff back brace as precursor for ADLs and functional mobility.  OT Frequency: Min 2X/week   Barriers to D/C:            Co-evaluation PT/OT/SLP Co-Evaluation/Treatment: Yes Reason for Co-Treatment: For patient/therapist safety   OT goals addressed during session: ADL's and self-care;Other (comment) (mobility)      End of Session Equipment Utilized During Treatment: Gait belt;Rolling walker;Back brace  Activity Tolerance: Patient tolerated treatment well Patient left: in bed;with call bell/phone within reach;with bed alarm set;with SCD's reapplied   Time: [id number] OT Time Calculation (min): 31 min Charges:  OT General Charges $OT Visit: 1 Procedure OT Evaluation $OT Eval Moderate Complexity: 1 Procedure G-Codes:     Binnie Kand M.S., OTR/L PagerJN:[phone]  02/09/2016, 2:08 PM

## 2016-02-09 NOTE — Progress Notes (Signed)
Call placed to Neurosurgeon on call to advise that the patient was having trouble urinating. Left message with answering service awaiting call back.

## 2016-02-10 ENCOUNTER — Encounter (HOSPITAL_COMMUNITY): Payer: Self-pay | Admitting: Neurological Surgery

## 2016-02-10 MED ORDER — LIDOCAINE HCL 2 % EX GEL
1.0000 "application " | Freq: Once | CUTANEOUS | Status: AC
  Administered 2016-02-10: 1 via URETHRAL
  Filled 2016-02-10 (×2): qty 20

## 2016-02-10 NOTE — Progress Notes (Signed)
Occupational Therapy Treatment Patient Details Name: Jared Solomon MRN: [id number] DOB: 1956-02-09 Today's Date: 02/10/2016    History of present illness Patient is a 60 y/o male with hx of COPD, bipolar disorder and ca presents s/p T10-L3 dorsal internal fixation and fusion with pedicle screws bilaterally at T10, T11, T12, L2, and L3; T12-L1 laminectomy for decompression due to burst fx after accident operating a dump trunk.    OT comments  Pt making gradual progress toward OT goals today; limited by pain. Pt declining OOB mobility but agreed to EOB for AE education. Educated pt on use of AE for increased independence with LB ADLs; pt able to return demo use of reacher and sock aide. Pt continues to require max assist to don/doff TLSO brace and is unable to verbally recall back precautions. Feel short term SNF placement would be most appropriate for pt prior to return home, however pt continues to refuse stating his son can take care of him at home. Will continue to follow acutely.   Follow Up Recommendations  Home health OT;Supervision/Assistance - 24 hour    Equipment Recommendations  3 in 1 bedside comode;Tub/shower bench;Other (comment) (AE)    Recommendations for Other Services      Precautions / Restrictions Precautions Precautions: Back Precaution Booklet Issued: No Precaution Comments: Pt unable to recall back precautions. Reviewed all precautions with pt. Required Braces or Orthoses: Spinal Brace Spinal Brace: Thoracolumbosacral orthotic;Applied in sitting position Restrictions Weight Bearing Restrictions: No       Mobility Bed Mobility Overal bed mobility: Needs Assistance Bed Mobility: Rolling;Sidelying to Sit;Sit to Sidelying Rolling: Min guard Sidelying to sit: Min guard;HOB elevated     Sit to sidelying: Min assist;HOB elevated (for LEs into bed) General bed mobility comments: VCs for log roll technique. Heavy use of bed rail.  Transfers          General  transfer comment: Not assessed at this time secondary to pain    Balance Overall balance assessment: Needs assistance Sitting-balance support: Feet supported;No upper extremity supported Sitting balance-Leahy Scale: Poor Sitting balance - Comments: Requires BUE support today due to pain.   Standing balance support: During functional activity Standing balance-Leahy Scale: Poor Standing balance comment: Reliant on BUEs for support in standing.                    ADL Overall ADL's : Needs assistance/impaired               Lower Body Bathing Details (indicate cue type and reason): Educated pt on use of long handled sponge for increased independence with LB bathing. Upper Body Dressing : Maximal assistance;Sitting Upper Body Dressing Details (indicate cue type and reason): to don/doff TLSO brace Lower Body Dressing: Minimal assistance;With adaptive equipment;Sit to/from stand Lower Body Dressing Details (indicate cue type and reason): Educated pt on use of long handled sponge, reacher, and sock aide. Pt able to return demo use of reacher and sock aide.               General ADL Comments: Pt declined OOB mobility at this time secondary to pain. Discussed importance of ambulation; pt verbalized understanding but reports pain too high at this time to participate.       Vision                     Perception     Praxis      Cognition   Behavior During Therapy: [facility] for tasks assessed/performed  Overall Cognitive Status: Within Functional Limits for tasks assessed       Memory: Decreased recall of precautions               Extremity/Trunk Assessment               Exercises     Shoulder Instructions       General Comments      Pertinent Vitals/ Pain       Pain Assessment: 0-10 Pain Score: 10-Worst pain ever Pain Location: back, BLEs Pain Descriptors / Indicators: Aching;Grimacing;Sharp;Guarding Pain Intervention(s): Limited activity  within patient's tolerance;Monitored during session;Premedicated before session  Home Living                                          Prior Functioning/Environment              Frequency Min 2X/week     Progress Toward Goals  OT Goals(current goals can now be found in the care plan section)  Progress towards OT goals: Progressing toward goals (Gradually; limited by pain)  Acute Rehab OT Goals Patient Stated Goal: to return home and to decrease pain OT Goal Formulation: With patient  Plan Discharge plan remains appropriate    Co-evaluation                 End of Session Equipment Utilized During Treatment: Back brace;Other (comment) (AE)   Activity Tolerance Patient limited by pain   Patient Left in bed;with call bell/phone within reach;with bed alarm set;with SCD's reapplied   Nurse Communication          Time: [id number] OT Time Calculation (min): 27 min  Charges: OT General Charges $OT Visit: 1 Procedure OT Treatments $Self Care/Home Management : 8-22 mins $Therapeutic Activity: 8-22 mins  Binnie Kand M.S., OTR/L Pager: [phone]  02/10/2016, 4:14 PM

## 2016-02-10 NOTE — Progress Notes (Signed)
Patient ID: Jared Solomon, male   DOB: [birth year], 60 y.o.   MRN: [id number] Vital signs stable Drains with minimal output... Discontinue Still with significant leg and back pain  not able to void and having significant bladder neck obstruction on placing foley Will allow foley to remain inplace Continue to mobilize.

## 2016-02-10 NOTE — Progress Notes (Signed)
Physical Therapy Treatment Patient Details Name: Jared Solomon MRN: [id number] DOB: 12/26/1955 Today's Date: 02/10/2016    History of Present Illness Patient is a 60 y/o male with hx of COPD, bipolar disorder and ca presents s/p T10-L3 dorsal internal fixation and fusion with pedicle screws bilaterally at T10, T11, T12, L2, and L3; T12-L1 laminectomy for decompression due to burst fx after accident operating a dump trunk.     PT Comments    Patient's mobility limited by pain today despite being premedicated. Continues to require Mod-Max A to stand from all surfaces. Bil knee instability present and notable during gait training. Pt is a high fall risk. Requires Min A for mobility with close chair follow due to fatigue. Still need to perform family training for home. Continue to recommend ST SNF as pt's mobility not improving safely enough to return home however pt refusing. Will follow.   Follow Up Recommendations  Home health PT;Supervision/Assistance - 24 hour (refusing rehab)     Equipment Recommendations  Rolling walker with 5" wheels;3in1 (PT)    Recommendations for Other Services       Precautions / Restrictions Precautions Precautions: Back Precaution Booklet Issued: No Precaution Comments: Reviewed precautions Required Braces or Orthoses: Spinal Brace Spinal Brace: Thoracolumbosacral orthotic;Applied in sitting position Restrictions Weight Bearing Restrictions: No    Mobility  Bed Mobility Overal bed mobility: Needs Assistance Bed Mobility: Rolling;Sidelying to Sit;Sit to Sidelying Rolling: Min guard Sidelying to sit: Min guard;HOB elevated     Sit to sidelying: Mod assist General bed mobility comments: VCs for log roll technique. Heavy use of rails. Assist to bring BLEs into bed.   Transfers Overall transfer level: Needs assistance Equipment used: Rolling walker (2 wheeled) Transfers: Sit to/from Stand Sit to Stand: Mod assist;Max assist         General  transfer comment: Mod-Max A to boost from EOB and from chair x1 with cues for hand placement. Difficulty extending knees to get to standing position. Uncontrolled descent into chair x2.   Ambulation/Gait Ambulation/Gait assistance: Min assist Ambulation Distance (Feet): 15 Feet (+_35') Assistive device: Rolling walker (2 wheeled) Gait Pattern/deviations: Narrow base of support;Step-through pattern;Scissoring Gait velocity: decreased Gait velocity interpretation: Below normal speed for age/gender General Gait Details: Cues to widen BoS and to decrease stride length. 1 seated rest break. Increased hip/knee flexion esp when fatigued.    Stairs            Wheelchair Mobility    Modified Rankin (Stroke Patients Only)       Balance Overall balance assessment: Needs assistance Sitting-balance support: Feet supported;Bilateral upper extremity supported Sitting balance-Leahy Scale: Poor Sitting balance - Comments: Requires BUE support today due to pain.   Standing balance support: During functional activity Standing balance-Leahy Scale: Poor Standing balance comment: Reliant on BUEs for support in standing.                     Cognition Arousal/Alertness: Awake/alert Behavior During Therapy: WFL for tasks assessed/performed Overall Cognitive Status: Within Functional Limits for tasks assessed (very distracted by pain)       Memory: Decreased recall of precautions              Exercises      General Comments        Pertinent Vitals/Pain Pain Assessment: 0-10 Pain Score: 10-Worst pain ever Pain Location: back Pain Descriptors / Indicators: Grimacing;Guarding;Sore;Aching Pain Intervention(s): Monitored during session;Repositioned;Premedicated before session;Limited activity within patient's tolerance    Home Living  Prior Function            PT Goals (current goals can now be found in the care plan section) Progress  towards PT goals: Not progressing toward goals - comment (limited by pain)    Frequency  Min 4X/week    PT Plan Current plan remains appropriate    Co-evaluation             End of Session Equipment Utilized During Treatment: Gait belt;Back brace Activity Tolerance: Patient limited by pain Patient left: in bed;with call bell/phone within reach;with bed alarm set;with SCD's reapplied     Time: [id number] PT Time Calculation (min) (ACUTE ONLY): 36 min  Charges:  $Gait Training: 23-37 mins                    G Codes:      Rayvon Dakin A Zoiey Christy 02/10/2016, 3:33 PM Wray Kearns, [place], DPT [phone]

## 2016-02-10 NOTE — Progress Notes (Signed)
PT Cancellation Note  Patient Details Name: Jared Solomon MRN: [id number] DOB: 04/30/1956   Cancelled Treatment:    Reason Eval/Treat Not Completed: Pain limiting ability to participate pt just finished working with OT and having increased pain. Will follow up in PM as time allows.   Marguarite Arbour A Shanikia [employer] 02/10/2016, 10:50 AM Wray Kearns, PT, DPT [phone]

## 2016-02-11 NOTE — Progress Notes (Signed)
Occupational Therapy Treatment Patient Details Name: Jared Solomon MRN: [id number] DOB: 1956-05-07 Today's Date: 02/11/2016    History of present illness Patient is a 60 y/o male with hx of COPD, bipolar disorder and ca presents s/p T10-L3 dorsal internal fixation and fusion with pedicle screws bilaterally at T10, T11, T12, L2, and L3; T12-L1 laminectomy for decompression due to burst fx after accident operating a dump trunk.    OT comments  Pt in significant pain limiting his progress toward OT goals. Pt only able to tolerate stand pivot transfer from chair to bed; required max assist for sit to stand and mod assist for stand pivot. Required min assist overall for bed mobility this session. Upgraded d/c plan to SNF for follow up; discussed with pt and he is agreeable. Will continue to follow pt acutely.   Follow Up Recommendations  SNF;Supervision/Assistance - 24 hour    Equipment Recommendations  3 in 1 bedside comode;Tub/shower bench;Other (comment) (AE)    Recommendations for Other Services      Precautions / Restrictions Precautions Precautions: Back Precaution Booklet Issued: No Precaution Comments: Pt unable to recall back precautions. Reviewed all precautions with pt. Required Braces or Orthoses: Spinal Brace Spinal Brace: Thoracolumbosacral orthotic;Applied in sitting position Restrictions Weight Bearing Restrictions: No       Mobility Bed Mobility Overal bed mobility: Needs Assistance Bed Mobility: Rolling;Sit to Sidelying Rolling: Min assist Sidelying to sit: Min assist       General bed mobility comments: HOB flat with use of rails.  Transfers Overall transfer level: Needs assistance Equipment used: Rolling walker (2 wheeled) Transfers: Sit to/from [facility] Sit to Stand: Max assist Stand pivot transfers: Mod assist       General transfer comment: Max assist to boost up from chair. Good hand placement and technique.    Balance  Overall balance assessment: Needs assistance Sitting-balance support: Feet supported;Bilateral upper extremity supported Sitting balance-Leahy Scale: Poor Sitting balance - Comments: Requires BUE support today due to pain.   Standing balance support: Bilateral upper extremity supported Standing balance-Leahy Scale: Poor                     ADL Overall ADL's : Needs assistance/impaired                 Upper Body Dressing : Maximal assistance;Sitting Upper Body Dressing Details (indicate cue type and reason): to doff TLSO brace                   General ADL Comments: Pt unable to tolerate ADLs today secondary to pain. Pt only able to perform stand pivot transfer from chair to bed.      Vision                     Perception     Praxis      Cognition   Behavior During Therapy: WFL for tasks assessed/performed Overall Cognitive Status: Within Functional Limits for tasks assessed       Memory: Decreased recall of precautions               Extremity/Trunk Assessment               Exercises     Shoulder Instructions       General Comments      Pertinent Vitals/ Pain       Pain Assessment: Faces Faces Pain Scale: Hurts worst Pain Location: back Pain Descriptors / Indicators: Grimacing;Aching  Pain Intervention(s): Limited activity within patient's tolerance;Monitored during session;Repositioned  Home Living                                          Prior Functioning/Environment              Frequency Min 2X/week     Progress Toward Goals  OT Goals(current goals can now be found in the care plan section)  Progress towards OT goals: Not progressing toward goals - comment (limited by pain)  Acute Rehab OT Goals Patient Stated Goal: decrease pain OT Goal Formulation: With patient  Plan Discharge plan needs to be updated    Co-evaluation                 End of Session Equipment Utilized  During Treatment: Rolling walker;Back brace   Activity Tolerance Patient limited by pain   Patient Left in bed;with call bell/phone within reach;with bed alarm set   Nurse Communication          Time: [id number] OT Time Calculation (min): 14 min  Charges: OT Treatments $Therapeutic Activity: 8-22 mins  Binnie Kand M.S., OTR/L Pager: [phone]  02/11/2016, 1:24 PM

## 2016-02-11 NOTE — Progress Notes (Signed)
Pt in bed at this time. Encouraged him to ambulate and sit up in a chair for dinner but he refused.

## 2016-02-11 NOTE — Progress Notes (Signed)
Physical Therapy Treatment Patient Details Name: Jared Solomon MRN: [id number] DOB: Jul 18, 1956 Today's Date: 02/11/2016    History of Present Illness Patient is a 60 y/o male with hx of COPD, bipolar disorder and ca presents s/p T10-L3 dorsal internal fixation and fusion with pedicle screws bilaterally at T10, T11, T12, L2, and L3; T12-L1 laminectomy for decompression due to burst fx after accident operating a dump trunk.     PT Comments    Patient continues to have pain limiting mobility but agreeable to session. Requires Mod-Max A to stand from all surfaces. Not able to walk as far today due to weakness/pain. Pt now coughing up phlegm. Educated pt on importance of mobility and being out of bed to decrease risks of PNA and DVTs. Discharge recommendation updated to ST SNF due to lack of improvement with mobility. Pt not safe to return home. Reports some improved strength in BLEs however still requires assist for balance. Will follow acutely.   Follow Up Recommendations  SNF     Equipment Recommendations  Rolling walker with 5" wheels;3in1 (PT)    Recommendations for Other Services       Precautions / Restrictions Precautions Precautions: Back Precaution Booklet Issued: No Precaution Comments: Pt unable to recall back precautions. Reviewed all precautions with pt. Required Braces or Orthoses: Spinal Brace Spinal Brace: Thoracolumbosacral orthotic;Applied in sitting position Restrictions Weight Bearing Restrictions: No    Mobility  Bed Mobility Overal bed mobility: Needs Assistance Bed Mobility: Rolling;Sidelying to Sit Rolling: Min guard Sidelying to sit: Min guard;HOB elevated       General bed mobility comments: HOB elevated 20 degrees. Heavy use of rail. Increased time. Good demo of log roll.  Transfers Overall transfer level: Needs assistance Equipment used: Rolling walker (2 wheeled) Transfers: Sit to/from Stand Sit to Stand: Max assist;Mod assist          General transfer comment: Mod-Max A to stand from all surfaces due to weakness in BLEs especially in mid range. Stood from [employer], from chair x1.   Ambulation/Gait Ambulation/Gait assistance: Mod assist Ambulation Distance (Feet): 20 Feet (+ 35') Assistive device: Rolling walker (2 wheeled) Gait Pattern/deviations: Step-through pattern;Decreased stride length;Scissoring;Narrow base of support;Trunk flexed Gait velocity: decreased   General Gait Details: Cues to widen BoS and to decrease stride length. 1 seated rest break. Increased hip/knee flexion esp when fatigued. Unsteady. DOE.    Stairs            Wheelchair Mobility    Modified Rankin (Stroke Patients Only)       Balance Overall balance assessment: Needs assistance Sitting-balance support: Feet supported;No upper extremity supported Sitting balance-Leahy Scale: Poor Sitting balance - Comments: Requires BUE support today due to pain.   Standing balance support: During functional activity Standing balance-Leahy Scale: Poor                      Cognition Arousal/Alertness: Awake/alert Behavior During Therapy: WFL for tasks assessed/performed Overall Cognitive Status: Within Functional Limits for tasks assessed       Memory: Decreased recall of precautions              Exercises      General Comments        Pertinent Vitals/Pain Pain Assessment: Faces Faces Pain Scale: Hurts whole lot Pain Location: back, feet Pain Descriptors / Indicators: Grimacing;Guarding;Sore;Aching Pain Intervention(s): Monitored during session;Repositioned;Premedicated before session;Limited activity within patient's tolerance    Home Living  Prior Function            PT Goals (current goals can now be found in the care plan section) Progress towards PT goals: Progressing toward goals (very slowly- pain limiting)    Frequency  Min 4X/week    PT Plan Discharge plan needs to be  updated    Co-evaluation             End of Session Equipment Utilized During Treatment: Gait belt;Back brace Activity Tolerance: Patient limited by pain Patient left: in chair;with call bell/phone within reach;with chair alarm set     Time: 1133-1200 PT Time Calculation (min) (ACUTE ONLY): 27 min  Charges:  $Gait Training: 23-37 mins                    G Codes:      Jared Solomon 02/11/2016, 12:16 PM Jared Solomon, [place], DPT [phone]

## 2016-02-11 NOTE — Progress Notes (Signed)
Patient ID: Jared Solomon, male   DOB: Jun 18, 1956, 60 y.o.   MRN: [id number] Vital signs remained stable Motor function is improving though patient still has some sensitivity in lower extremities He is ambulate some with assistance He'll need skilled nursing facility Awaiting placement

## 2016-02-11 NOTE — Care Management Important Message (Signed)
Important Message  Patient Details  Name: Muzammil Hettich MRN: [id number] Date of Birth: April 20, 1956   Medicare Important Message Given:  Yes    Barb Merino Rico Massar 02/11/2016, 12:56 PM

## 2016-02-12 MED ORDER — GABAPENTIN 300 MG PO CAPS
300.0000 mg | ORAL_CAPSULE | Freq: Three times a day (TID) | ORAL | Status: DC
  Administered 2016-02-12 – 2016-02-13 (×6): 300 mg via ORAL
  Filled 2016-02-12 (×6): qty 1

## 2016-02-12 NOTE — Progress Notes (Signed)
Physical Therapy Treatment Patient Details Name: Arie Dankers MRN: [id number] DOB: 1956-07-27 Today's Date: 02/12/2016    History of Present Illness Patient is a 60 y/o male with hx of COPD, bipolar disorder and ca presents s/p T10-L3 dorsal internal fixation and fusion with pedicle screws bilaterally at T10, T11, T12, L2, and L3; T12-L1 laminectomy for decompression due to burst fx after accident operating a dump trunk.     PT Comments    Patient is progressing gradually toward mobility goals. Pt declined sitting up in chair end of session despite further education on importance to decrease risks of PNA and DVTs. Continue to recommend SNF further skilled PT services to maximize independence and safety with mobility.  Follow Up Recommendations  SNF     Equipment Recommendations  Rolling walker with 5" wheels;3in1 (PT)    Recommendations for Other Services OT consult;Rehab consult     Precautions / Restrictions Precautions Precautions: Back Precaution Booklet Issued: No Required Braces or Orthoses: Spinal Brace Spinal Brace: Thoracolumbosacral orthotic;Applied in sitting position Restrictions Weight Bearing Restrictions: No    Mobility  Bed Mobility Overal bed mobility: Needs Assistance Bed Mobility: Rolling;Sit to Sidelying;Sidelying to Sit Rolling: Min guard;Min assist Sidelying to sit: Min assist     Sit to sidelying: Min assist General bed mobility comments: cues for sequencing and technique; use of bed rails; assist to elevate trunk into sitting and to elevate bilat LE into bed  Transfers Overall transfer level: Needs assistance Equipment used: Rolling walker (2 wheeled) Transfers: Sit to/from Stand Sit to Stand: Mod assist;+2 physical assistance;+2 safety/equipment         General transfer comment: assist to power up into standing; cues for hand placement  Ambulation/Gait Ambulation/Gait assistance: Min assist Ambulation Distance (Feet): 50 Feet  (25X2) Assistive device: Rolling walker (2 wheeled) Gait Pattern/deviations: Step-through pattern;Decreased stride length;Decreased dorsiflexion - right;Decreased dorsiflexion - left;Narrow base of support     General Gait Details: cues for bilat heel strike, widened BOS, and position of RW; one seated rest break needed;    Stairs            Wheelchair Mobility    Modified Rankin (Stroke Patients Only)       Balance Overall balance assessment: Needs assistance Sitting-balance support: Feet supported;Bilateral upper extremity supported Sitting balance-Leahy Scale: Poor     Standing balance support: Bilateral upper extremity supported Standing balance-Leahy Scale: Poor                      Cognition Arousal/Alertness: Awake/alert Behavior During Therapy: WFL for tasks assessed/performed Overall Cognitive Status: Within Functional Limits for tasks assessed                      Exercises      General Comments        Pertinent Vitals/Pain Pain Assessment: 0-10 Pain Score: 10-Worst pain ever Pain Location: back Pain Descriptors / Indicators: Sore;Aching Pain Intervention(s): Limited activity within patient's tolerance;Monitored during session;Premedicated before session;Repositioned    Home Living                      Prior Function            PT Goals (current goals can now be found in the care plan section) Acute Rehab PT Goals Patient Stated Goal: decrease pain Progress towards PT goals: Progressing toward goals    Frequency  Min 4X/week    PT Plan Current plan remains appropriate  Co-evaluation             End of Session Equipment Utilized During Treatment: Gait belt;Back brace Activity Tolerance: Patient limited by pain Patient left: with call bell/phone within reach;in bed;with bed alarm set;with SCD's reapplied     Time: 1128-1200 PT Time Calculation (min) (ACUTE ONLY): 32 min  Charges:  $Gait Training:  8-22 mins $Therapeutic Activity: 8-22 mins                    G Codes:      Salina April, PTA Pager: [phone]   02/12/2016, 12:53 PM

## 2016-02-12 NOTE — Progress Notes (Signed)
Filed Vitals:   02/11/16 1722 02/11/16 2156 02/12/16 0212 02/12/16 0516  BP: 121/63 95/58 109/56 92/51  Pulse: 112 114 118 108  Temp: 98.2 F (36.8 C) 98.7 F (37.1 C) 97.9 F (36.6 C) 98.6 F (37 C)  TempSrc: Oral Oral Oral Oral  Resp: 16 16 18 18   Height:      Weight:      SpO2: 99% 98% 98% 96%    Patient resting in bed, having dysesthetic pain in shins and heels. On gabapentin 200 mg 3 times a day, will increase to 300 mg 3 times a day. Continue to work with PT and OT. Nursing staff using peripheral IV, we'll ask for central line to be DC'd.  Plan: Continue PT and OT.  Hosie Spangle, MD 02/12/2016, 8:43 AM

## 2016-02-13 NOTE — Progress Notes (Signed)
No issues overnight. Pt cont to report burning pain in the bottom of his feet. Able to ambulate with rolling walker with PT.  EXAM:  BP 136/70 mmHg  Pulse 68  Temp(Src) 98.3 F (36.8 C) (Oral)  Resp 18  Ht 6\' 2"  (1.88 m)  Wt 68.04 kg (150 lb)  BMI 19.25 kg/m2  SpO2 99%  Awake, alert, oriented  Speech fluent, appropriate  CN grossly intact  Mild BLE paresis  IMPRESSION:  60 y.o. male POD# 5 s/p thoracolumbar fusion for burst fracture, progressing well  PLAN: - Cont to mobilize - Neurontin dose increased yesterday - Will keep Foley till tomorrow, may consider trial of void after that - Will need SNF placement

## 2016-02-13 NOTE — Progress Notes (Signed)
Patient noted to have a swollen area to the bottom of his lower back and complains his tailbone hurts. Will notify MD.

## 2016-02-14 LAB — GLUCOSE, CAPILLARY: GLUCOSE-CAPILLARY: 105 mg/dL — AB (ref 65–99)

## 2016-02-14 MED ORDER — DIAZEPAM 5 MG PO TABS: 5.0000 mg | ORAL_TABLET | Freq: Four times a day (QID) | ORAL | Status: AC | PRN

## 2016-02-14 MED ORDER — GABAPENTIN 300 MG PO CAPS: 600.0000 mg | ORAL_CAPSULE | Freq: Three times a day (TID) | ORAL | Status: AC

## 2016-02-14 MED ORDER — GABAPENTIN 300 MG PO CAPS
600.0000 mg | ORAL_CAPSULE | Freq: Three times a day (TID) | ORAL | Status: DC
  Administered 2016-02-14 – 2016-02-15 (×2): 600 mg via ORAL
  Filled 2016-02-14 (×2): qty 2

## 2016-02-14 MED ORDER — BISACODYL 5 MG PO TBEC: 5.0000 mg | DELAYED_RELEASE_TABLET | Freq: Every day | ORAL | Status: AC | PRN

## 2016-02-14 MED ORDER — OXYCODONE-ACETAMINOPHEN 7.5-325 MG PO TABS: 1.0000 | ORAL_TABLET | ORAL | Status: DC | PRN

## 2016-02-14 MED ORDER — OXYCODONE HCL 5 MG PO TABS: 5.0000 mg | ORAL_TABLET | ORAL | Status: AC | PRN

## 2016-02-14 MED ORDER — OXYCODONE-ACETAMINOPHEN 7.5-325 MG PO TABS: 1.0000 | ORAL_TABLET | ORAL | Status: AC | PRN

## 2016-02-14 NOTE — Progress Notes (Signed)
Patient was bladder ed scan for residual and he has 312 cc of urine noted

## 2016-02-14 NOTE — Progress Notes (Signed)
Patient will DC to: [place] date: 02/14/16 Family notified: N/A Patient oriented Transport by: Corey Harold   Per MD patient ready for DC to [facility], patient, patient's family, and facility notified of DC. RN given number for report. DC packet on chart. Ambulance transport requested for patient.   CSW signing off.  Cedric Fishman, [place] Social Worker [phone]

## 2016-02-14 NOTE — Clinical Social Work Placement (Signed)
   CLINICAL SOCIAL WORK PLACEMENT  NOTE  Date:  02/14/2016  Patient Details  Name: Jared Solomon MRN: [id number] Date of Birth: 02-21-1956  Clinical Social Work is seeking post-discharge placement for this patient at the [place] level of care (*CSW will initial, date and re-position this form in  chart as items are completed):  Yes   Patient/family provided with [place] Work Department's list of facilities offering this level of care within the geographic area requested by the patient (or if unable, by the patient's family).  Yes   Patient/family informed of their freedom to choose among providers that offer the needed level of care, that participate in Medicare, Medicaid or managed care program needed by the patient, have an available bed and are willing to accept the patient.  Yes   Patient/family informed of [facility]'s ownership interest in [facility] and [facility], as well as of the fact that they are under no obligation to receive care at these facilities.  PASRR submitted to EDS on 02/14/16     PASRR number received on 02/14/16     Existing PASRR number confirmed on       FL2 transmitted to all facilities in geographic area requested by pt/family on 02/14/16     FL2 transmitted to all facilities within larger geographic area on       Patient informed that his/her managed care company has contracts with or will negotiate with certain facilities, including the following:        Yes   Patient/family informed of bed offers received.  Patient chooses bed at [place]     Physician recommends and patient chooses bed at      Patient to be transferred to [place] on 02/14/16.  Patient to be transferred to facility by [facility]     Patient family notified on 02/14/16 of transfer.  Name of family member notified:  N/A     PHYSICIAN       Additional Comment:     _______________________________________________ Benard Halsted, [place] 02/14/2016, 3:45 PM

## 2016-02-14 NOTE — Clinical Social Work Note (Signed)
Clinical Social Work Assessment  Patient Details  Name: Jared Solomon MRN: [id number] Date of Birth: Nov 08, 1955  Date of referral:  02/14/16               Reason for consult:  Facility Placement                Permission sought to share information with:  Chartered certified accountant granted to share information::  No  Name::        Agency::     Relationship::     Contact Information:     Housing/Transportation Living arrangements for the past 2 months:  Single Family Home Source of Information:  Patient Patient Interpreter Needed:  None Criminal Activity/Legal Involvement Pertinent to Current Situation/Hospitalization:  No - Comment as needed Significant Relationships:  Adult Children Lives with:  Adult Children Do you feel safe going back to the place where you live?  No Need for family participation in patient care:  Yes (Comment)  Care giving concerns:  CSW received referral for possible SNF placement at time of discharge. CSW met with patient regarding PT recommendation of SNF placement at time of discharge. Patient reports being currently unable to care for himself at home given patient's current physical needs and fall risk. Patient expressed understanding of PT recommendation and is agreeable to SNF placement at time of discharge. CSW to continue to follow and assist with discharge planning needs.   Social Worker assessment / plan:  CSW spoke with patient concerning possibility of rehab at SNF before returning home with family.  Employment status:  Disabled (Comment on whether or not currently receiving Disability) Insurance information:  Managed Medicare PT Recommendations:  24 Hour Supervision Information / Referral to community resources:  [place]  Patient/Family's Response to care:  Patient recognizes need for rehab before returning home and is agreeable to a SNF in [place].  Patient/Family's Understanding of and Emotional  Response to Diagnosis, Current Treatment, and Prognosis:  Patient is realistic regarding therapy needs. No questions/concerns about plan or treatment.    Emotional Assessment Appearance:  Appears stated age Attitude/Demeanor/Rapport:   (Appropriate) Affect (typically observed):  Accepting, Adaptable Orientation:  Oriented to Self, Oriented to Place, Oriented to  Time, Oriented to Situation Alcohol / Substance use:  Not Applicable Psych involvement (Current and /or in the community):  No (Comment)  Discharge Needs  Concerns to be addressed:  No discharge needs identified Readmission within the last 30 days:  No Current discharge risk:  None Barriers to Discharge:  No Barriers Identified   Benard Halsted, [place] 02/14/2016, 1:31 PM

## 2016-02-14 NOTE — Care Management Note (Signed)
Case Management Note  Patient Details  Name: Jared Solomon MRN: [id number] Date of Birth: 15-Sep-1956  Subjective/Objective:                    Action/Plan: Plan is for patient to discharge to SNF/rehab today. No further needs per CM.   Expected Discharge Date:                  Expected Discharge Plan:     In-House Referral:     Discharge planning Services     Post Acute Care Choice:    Choice offered to:     DME Arranged:    DME Agency:     HH Arranged:    HH Agency:     Status of Service:  In process, will continue to follow  Medicare Important Message Given:  Yes Date Medicare IM Given:    Medicare IM give by:    Date Additional Medicare IM Given:    Additional Medicare Important Message give by:     If discussed at [place] of Stay Meetings, dates discussed:    Additional Comments:  Pollie Friar, RN 02/14/2016, 2:08 PM

## 2016-02-14 NOTE — NC FL2 (Signed)
[place] MEDICAID FL2 LEVEL OF CARE SCREENING TOOL     IDENTIFICATION  Patient Name: Jared Solomon Birthdate: [birth year] Sex: male Admission Date (Current Location): 02/07/2016  [facility] [place] Number:  Herbalist and Address:  [facility], [place] [address], [place], [place] [address]      Provider Number: [id number]  Attending Physician Name and Address:  Kevan Ny Ditty, MD  Relative Name and Phone Number:  Paulo Fruit [phone]    Current Level of Care: Hospital Recommended Level of Care: [place] Prior Approval Number:    Date Approved/Denied:   PASRR Number: [id number]  Discharge Plan: SNF    Current Diagnoses: Patient Active Problem List   Diagnosis Date Noted  . Burst fracture of lumbar vertebra ([place]) 02/07/2016    Orientation RESPIRATION BLADDER Height & Weight     Self, Time, Situation, Place  Normal Continent Weight: 150 lb (68.04 kg) Height:  6\' 2"  (188 cm)  BEHAVIORAL SYMPTOMS/MOOD NEUROLOGICAL BOWEL NUTRITION STATUS      Continent Diet (Please see DC summary)  AMBULATORY STATUS COMMUNICATION OF NEEDS Skin   Extensive Assist Verbally Surgical wounds (Closed Incision on back)                       Personal Care Assistance Level of Assistance  Bathing, Feeding, Dressing Bathing Assistance: Maximum assistance Feeding assistance: Independent Dressing Assistance: Limited assistance     Functional Limitations Info             SPECIAL CARE FACTORS FREQUENCY  PT (By licensed PT)     PT Frequency: min 3x/week              Contractures      Additional Factors Info  Code Status, Allergies Code Status Info: Not on File Allergies Info: Aspirin           Current Medications (02/14/2016):  This is the current hospital active medication list Current Facility-Administered Medications  Medication Dose Route Frequency Provider Last Rate Last Dose  . 0.9 %  sodium chloride infusion    Intravenous Once Zorita Pang, CRNA      . bisacodyl (DULCOLAX) EC tablet 5 mg  5 mg Oral Daily PRN Kevan Ny Ditty, MD      . bupivacaine liposome (EXPAREL) 1.3 % injection 266 mg  20 mL Infiltration Once Jake Church Masters, RPH      . diazepam (VALIUM) tablet 5 mg  5 mg Oral Q6H PRN Kevan Ny Ditty, MD   5 mg at 02/13/16 2004  . docusate sodium (COLACE) capsule 100 mg  100 mg Oral BID Kevan Ny Ditty, MD   100 mg at 02/13/16 2150  . gabapentin (NEURONTIN) capsule 600 mg  600 mg Oral TID Kevan Ny Ditty, MD      . menthol-cetylpyridinium (CEPACOL) lozenge 3 mg  1 lozenge Oral PRN Kevan Ny Ditty, MD       Or  . phenol (CHLORASEPTIC) mouth spray 1 spray  1 spray Mouth/Throat PRN Kevan Ny Ditty, MD      . methocarbamol (ROBAXIN) tablet 750 mg  750 mg Oral TID AC & HS Kevan Ny Ditty, MD   750 mg at 02/14/16 0744  . ondansetron (ZOFRAN) injection 4 mg  4 mg Intravenous Q4H PRN Kevan Ny Ditty, MD      . oxyCODONE (Oxy IR/ROXICODONE) immediate release tablet 5 mg  5 mg Oral Q3H PRN Tamala Fothergill, MD  5 mg at 02/14/16 0743  . oxyCODONE-acetaminophen (PERCOCET) 7.5-325 MG per tablet 1-2 tablet  1-2 tablet Oral Q4H PRN Kevan Ny Ditty, MD      . pantoprazole (PROTONIX) EC tablet 40 mg  40 mg Oral QHS Kevan Ny Ditty, MD   40 mg at 02/13/16 2150  . senna (SENOKOT) tablet 8.6 mg  1 tablet Oral BID Kevan Ny Ditty, MD   8.6 mg at 02/13/16 2150  . sodium phosphate (FLEET) 7-19 GM/118ML enema 1 enema  1 enema Rectal Once PRN Kevan Ny Ditty, MD      . tamsulosin [facility]) capsule 0.4 mg  0.4 mg Oral Daily Kristeen Miss, MD   0.4 mg at 02/13/16 1036  . zolpidem (AMBIEN) tablet 5 mg  5 mg Oral QHS PRN,MR X 1 Tamala Fothergill, MD   5 mg at 02/13/16 2151     Discharge Medications: Please see discharge summary for a list of discharge medications.  Relevant Imaging Results:  Relevant Lab Results:   Additional Information SSN: [place], [place]

## 2016-02-14 NOTE — Progress Notes (Signed)
Per RN, patient has not yet voided. Patient may discharge to [facility] SNF tomorrow morning if patient has not voided today by 3:30pm.  Cedric Fishman LCSWA [phone]

## 2016-02-14 NOTE — Discharge Summary (Addendum)
Date of Admission: 02/07/16  Date of Discharge: 02/15/16  Admission Diagnosis: L1 burst fracture with spinal instability, conus medullaris injury  Discharge Diagnosis: Same  Procedure Performed: T10-L3 dorsal internal fixation and fusion with decompression at L1 and open reduction of fracture fragments  Attending: [facility] Course:  The patient was admitted with the above listed diagnosis.  He urgently underwent the above listed surgical procedure.  He tolerated this well.  His post-operative course has been complicated due to the conus medullaris injury, particularly because of refractory dysesthetic pain and urinary retention.  He has worked with therapy and will require a skilled nursing facility where he can recover and participate with therapy.  He is discharged at this time in stable medical and neurological condition.  Post discharge instructions: Continue indwelling catheter to gravity.  Change catheter every 30 days.  Patient to follow up with urology as an outpatient in approximately three weeks.  They will make arrangements for this.  Follow up: 3 weeks    Medication List    TAKE these medications        oxyCODONE-acetaminophen 5-325 MG tablet  Commonly known as:  PERCOCET/ROXICET  Take 1 tablet by mouth every 4 (four) hours as needed.      ASK your doctor about these medications        baclofen 10 MG tablet  Commonly known as:  LIORESAL  Take 10 mg by mouth 3 (three) times daily.     BREO ELLIPTA 100-25 MCG/INH Aepb  Generic drug:  fluticasone furoate-vilanterol  Inhale 1 puff into the lungs daily.     fluticasone 50 MCG/ACT nasal spray  Commonly known as:  FLONASE  Place 2 sprays into both nostrils daily as needed for allergies or rhinitis.     gabapentin 100 MG capsule  Commonly known as:  NEURONTIN  Take 300 mg by mouth 2 (two) times daily.     megestrol 400 MG/10ML suspension  Commonly known as:  MEGACE  Take 800 mg by mouth daily.     pantoprazole  40 MG tablet  Commonly known as:  PROTONIX  Take 40 mg by mouth 2 (two) times daily.     sildenafil 50 MG tablet  Commonly known as:  VIAGRA  Take 50 mg by mouth daily as needed for erectile dysfunction.     tamsulosin 0.4 MG Caps capsule  Commonly known as:  FLOMAX  Take 0.4 mg by mouth daily.     tiotropium 18 MCG inhalation capsule  Commonly known as:  SPIRIVA  Place 18 mcg into inhaler and inhale daily.     zolpidem 10 MG tablet  Commonly known as:  AMBIEN  Take 10 mg by mouth at bedtime as needed for sleep.

## 2016-02-14 NOTE — Progress Notes (Signed)
Physical Therapy Treatment Patient Details Name: Jared Solomon MRN: [id number] DOB: 05/22/1956 Today's Date: 02/14/2016    History of Present Illness Patient is a 60 y/o male with hx of COPD, bipolar disorder and ca presents s/p T10-L3 dorsal internal fixation and fusion with pedicle screws bilaterally at T10, T11, T12, L2, and L3; T12-L1 laminectomy for decompression due to burst fx after accident operating a dump trunk.     PT Comments    Pt mobility greatly limited today by freq loose stools. Pt remains to require significant assist transitioning from sitting to standing due to LE weakness and paresis. Pt remains appropriate for SNF.  Follow Up Recommendations  SNF     Equipment Recommendations  Rolling walker with 5" wheels    Recommendations for Other Services       Precautions / Restrictions Precautions Precautions: Back Precaution Comments: re-educated on back precautions Required Braces or Orthoses: Spinal Brace Spinal Brace: Thoracolumbosacral orthotic;Applied in sitting position Restrictions Weight Bearing Restrictions: No    Mobility  Bed Mobility Overal bed mobility: Needs Assistance Bed Mobility: Rolling;Sidelying to Sit Rolling: Min guard Sidelying to sit: Min assist       General bed mobility comments: used bed rail, HOB elevated, assist for LE management and then trunk elevation  Transfers Overall transfer level: Needs assistance Equipment used: Rolling walker (2 wheeled) Transfers: Sit to/from [facility] Sit to Stand: Mod assist;+2 physical assistance;+2 safety/equipment Stand pivot transfers: Mod assist;+2 safety/equipment       General transfer comment: pt with diarrhea, assisted to [facility], modA to maintain stability during transitions of hands from bed to RW  Ambulation/Gait             General Gait Details: unable to amb this date due to pt's freq loose stools   Stairs            Wheelchair Mobility     Modified Rankin (Stroke Patients Only)       Balance           Standing balance support: Bilateral upper extremity supported Standing balance-Leahy Scale: Poor Standing balance comment: had to hold onto RW for safe standing during hygiene s/p BM. pt dependent for hygiene                    Cognition Arousal/Alertness: Awake/alert Behavior During Therapy: WFL for tasks assessed/performed Overall Cognitive Status: Within Functional Limits for tasks assessed       Memory: Decreased recall of precautions              Exercises      General Comments General comments (skin integrity, edema, etc.): highly encouraged pt to sit up in chair, pt edcuated on importance of dec risk of PNA and blood clots, pt agreed after max encouragement      Pertinent Vitals/Pain Pain Assessment: 0-10 Pain Score: 8  Pain Location: LEs Pain Descriptors / Indicators:  (sensitivity) Pain Intervention(s): Monitored during session    Home Living                      Prior Function            PT Goals (current goals can now be found in the care plan section) Acute Rehab PT Goals Patient Stated Goal: decrease pain and sensitivity in my legs Progress towards PT goals: Not progressing toward goals - comment (limited by loose stools today)    Frequency  Min 4X/week    PT Plan  Current plan remains appropriate    Co-evaluation             End of Session Equipment Utilized During Treatment: Gait belt;Back brace Activity Tolerance:  (limited by loose stools) Patient left: with call bell/phone within reach;with bed alarm set;in chair     Time: [id number] PT Time Calculation (min) (ACUTE ONLY): 30 min  Charges:  $Therapeutic Activity: 23-37 mins                    G Codes:      Kingsley Callander 02/14/2016, 11:13 AM   Kittie Plater, PT, DPT Pager #: [phone] Office #: [phone]

## 2016-02-14 NOTE — Progress Notes (Signed)
Continues to complain of dysesthetic pain AVSS Awake and alert Motor strength stable Incision looks good Will try to remove foley today and see if he can void, if not will require leg bag Hopefully d/c to SNF

## 2016-02-14 NOTE — Progress Notes (Signed)
Dr. Cyndy Freeze called and report the findings of the bladder scan. [place] Education officer, museum was given his number to see of the facility can do in and out catheters for patient/putting the foley back in . Waiting for follow .

## 2016-02-14 NOTE — Progress Notes (Signed)
Replace catheter /MD and he will address  In am do not sent patient to facility at this time/ orders/Dr. Ditty

## 2016-02-15 NOTE — Progress Notes (Signed)
Pt d/c to snf by ambulance. Assessment stable. Report called to RN at snf. Pt d/c with foley per md. All questions answered

## 2016-02-15 NOTE — Consult Note (Signed)
Ok for patient to be discharged to SNF with catheter (coude or otherwise).  Continue Flomax.  We will schedule him for follow-up in our clinic in 3 weeks and address his urinary retention issues at that time.  He should continue with an indwelling foley in the interim.

## 2016-02-15 NOTE — Care Management Note (Signed)
Case Management Note  Patient Details  Name: Antony Pyatt MRN: [id number] Date of Birth: [birth year]  Subjective/Objective:                    Action/Plan: Pt discharging to SNF today. No further needs per CM.   Expected Discharge Date:  02/15/16               Expected Discharge Plan:  Skilled Nursing Facility  In-House Referral:  Clinical Social Work  Discharge planning Services  CM Consult  Post Acute Care Choice:    Choice offered to:     DME Arranged:    DME Agency:     HH Arranged:    [place] Agency:     Status of Service:  Completed, signed off  Medicare Important Message Given:  Yes Date Medicare IM Given:    Medicare IM give by:    Date Additional Medicare IM Given:    Additional Medicare Important Message give by:     If discussed at [place] of Stay Meetings, dates discussed:    Additional Comments:  Pollie Friar, RN 02/15/2016, 12:05 PM

## 2016-02-15 NOTE — Progress Notes (Signed)
Physical Therapy Treatment Patient Details Name: Jared Solomon MRN: [id number] DOB: 10/13/1955 Today's Date: 02/15/2016    History of Present Illness Patient is a 60 y/o male with hx of COPD, bipolar disorder and ca presents s/p T10-L3 dorsal internal fixation and fusion with pedicle screws bilaterally at T10, T11, T12, L2, and L3; T12-L1 laminectomy for decompression due to burst fx after accident operating a dump trunk.     PT Comments    Patient progressing with mobility requiring less assist as well as attempting to increase distance with ambulation.  Remains with allodynia limiting tolerance to feet on floor, but working through symptoms.  Noted tan and blood tinged sputum with coughing this session.  Will need continued rehab at SNF at d/c with planned transport this pm.    Follow Up Recommendations  SNF     Equipment Recommendations  Rolling walker with 5" wheels    Recommendations for Other Services       Precautions / Restrictions Precautions Precautions: Back Required Braces or Orthoses: Spinal Brace Spinal Brace: Thoracolumbosacral orthotic;Applied in sitting position    Mobility  Bed Mobility Overal bed mobility: Needs Assistance Bed Mobility: Rolling;Sidelying to Sit Rolling: Supervision Sidelying to sit: Min guard     Sit to sidelying: Mod assist General bed mobility comments: use of rail to roll and little assist as elevating trunk (pt managing legs with increased time); assist for legs into bed  Transfers Overall transfer level: Needs assistance Equipment used: Rolling walker (2 wheeled) Transfers: Sit to/from [facility] Sit to Stand: Min assist;+2 physical assistance Stand pivot transfers: Min assist;+2 safety/equipment       General transfer comment: up from bed and recliner (more help from low recliner); stand pivot initially up to [facility], then after ambulation back to bed with RW due to ambulance transport pending for d/c to  SNF  Ambulation/Gait Ambulation/Gait assistance: Min assist;+2 safety/equipment (with chair following) Ambulation Distance (Feet): 50 Feet Assistive device: Rolling walker (2 wheeled) Gait Pattern/deviations: Step-through pattern;Decreased stride length;Narrow base of support     General Gait Details: cues to widen BOS, heavy UE reliance, one turn in hallway min A for walker management   Stairs            Wheelchair Mobility    Modified Rankin (Stroke Patients Only)       Balance Overall balance assessment: Needs assistance           Standing balance-Leahy Scale: Poor Standing balance comment: UE support for balance due to weakness in LE's                    Cognition Arousal/Alertness: Awake/alert Behavior During Therapy: WFL for tasks assessed/performed Overall Cognitive Status: Within Functional Limits for tasks assessed                      Exercises      General Comments General comments (skin integrity, edema, etc.): felt like would have BM if coughed so up on BSC and assist for splinting back for clearing tan and bloody sputum, but no BM      Pertinent Vitals/Pain Pain Score: 8  Pain Location: back and LE's Pain Descriptors / Indicators: Aching;Sore Pain Intervention(s): Repositioned;Monitored during session;Limited activity within patient's tolerance    Home Living                      Prior Function  PT Goals (current goals can now be found in the care plan section) Progress towards PT goals: Progressing toward goals    Frequency  Min 4X/week    PT Plan Current plan remains appropriate    Co-evaluation             End of Session Equipment Utilized During Treatment: Gait belt;Back brace Activity Tolerance: Patient limited by pain Patient left: in bed;with call bell/phone within reach     Time: [id number] PT Time Calculation (min) (ACUTE ONLY): 27 min  Charges:  $Gait Training: 8-22  mins $Therapeutic Activity: 8-22 mins                    G Codes:      Reginia Naas 02/17/16, 4:04 PM  Magda Kiel, [place] Feb 17, 2016

## 2016-02-15 NOTE — Progress Notes (Signed)
Couldn't go to SNF yesterday due to issues with urinary retention and foley catheter Neuro stable Pain relatively well controlled Will talk with urology today and hopefully can go today

## 2016-02-15 NOTE — Progress Notes (Signed)
Patient will DC to: [facility] date: 02/15/16 Family notified: Patient alerting family Transport by: [facility] 11:30am   Per MD patient ready for DC to [place], patient, and facility notified of DC. RN given number for report. DC packet on chart. Ambulance transport requested for patient.   CSW signing off.  Cedric Fishman, [place] Social Worker [phone]

## 2016-02-15 NOTE — Care Management Important Message (Signed)
Important Message  Patient Details  Name: Jared Solomon MRN: [id number] Date of Birth: May 25, 1956   Medicare Important Message Given:  Yes    Pollie Friar, RN 02/15/2016, 11:46 AM

## 2016-02-15 NOTE — Clinical Social Work Placement (Signed)
   CLINICAL SOCIAL WORK PLACEMENT  NOTE  Date:  02/15/2016  Patient Details  Name: Trysten Presutti MRN: [id number] Date of Birth: Feb 13, 1956  Clinical Social Work is seeking post-discharge placement for this patient at the [place] level of care (*CSW will initial, date and re-position this form in  chart as items are completed):  Yes   Patient/family provided with [facility] Work Department's list of facilities offering this level of care within the geographic area requested by the patient (or if unable, by the patient's family).  Yes   Patient/family informed of their freedom to choose among providers that offer the needed level of care, that participate in Medicare, Medicaid or managed care program needed by the patient, have an available bed and are willing to accept the patient.  Yes   Patient/family informed of [facility]'s ownership interest in [facility] and [facility], as well as of the fact that they are under no obligation to receive care at these facilities.  PASRR submitted to [employer] on 02/14/16     PASRR number received on 02/14/16     Existing PASRR number confirmed on       FL2 transmitted to all facilities in geographic area requested by pt/family on 02/14/16     FL2 transmitted to all facilities within larger geographic area on       Patient informed that his/her managed care company has contracts with or will negotiate with certain facilities, including the following:        Yes   Patient/family informed of bed offers received.  Patient chooses bed at [place]     Physician recommends and patient chooses bed at      Patient to be transferred to [place] on 02/15/16.  Patient to be transferred to facility by [facility]     Patient family notified on 02/15/16 of transfer.  Name of family member notified:  N/A     PHYSICIAN       Additional Comment:     _______________________________________________ Benard Halsted, [place] 02/15/2016, 11:02 AM
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
